# Patient Record
Sex: Female | Born: 1950 | Race: White | Hispanic: No | Marital: Single | Smoking: Never smoker
Health system: Southern US, Community
[De-identification: ages and names within clinical notes are randomized; demographics above are authoritative.]

## PROBLEM LIST (undated history)

## (undated) DIAGNOSIS — C679 Malignant neoplasm of bladder, unspecified: Secondary | ICD-10-CM

## (undated) DIAGNOSIS — C50919 Malignant neoplasm of unspecified site of unspecified female breast: Secondary | ICD-10-CM

## (undated) DIAGNOSIS — Z923 Personal history of irradiation: Secondary | ICD-10-CM

## (undated) DIAGNOSIS — Z9221 Personal history of antineoplastic chemotherapy: Secondary | ICD-10-CM

---

## 2004-04-29 DIAGNOSIS — C679 Malignant neoplasm of bladder, unspecified: Secondary | ICD-10-CM

## 2004-04-29 DIAGNOSIS — Z9221 Personal history of antineoplastic chemotherapy: Secondary | ICD-10-CM

## 2004-04-29 HISTORY — DX: Personal history of antineoplastic chemotherapy: Z92.21

## 2004-04-29 HISTORY — DX: Malignant neoplasm of bladder, unspecified: C67.9

## 2006-04-29 DIAGNOSIS — C50919 Malignant neoplasm of unspecified site of unspecified female breast: Secondary | ICD-10-CM

## 2006-04-29 DIAGNOSIS — Z923 Personal history of irradiation: Secondary | ICD-10-CM

## 2006-04-29 HISTORY — DX: Personal history of irradiation: Z92.3

## 2006-04-29 HISTORY — PX: BREAST LUMPECTOMY: SHX2

## 2006-04-29 HISTORY — PX: BREAST BIOPSY: SHX20

## 2006-04-29 HISTORY — DX: Malignant neoplasm of unspecified site of unspecified female breast: C50.919

## 2010-06-04 ENCOUNTER — Ambulatory Visit: Payer: Self-pay | Admitting: Family Medicine

## 2010-08-29 ENCOUNTER — Ambulatory Visit: Payer: Self-pay | Admitting: Urology

## 2010-09-05 ENCOUNTER — Ambulatory Visit: Payer: Self-pay | Admitting: Urology

## 2010-12-13 ENCOUNTER — Ambulatory Visit: Payer: Self-pay

## 2011-11-13 ENCOUNTER — Ambulatory Visit: Payer: Self-pay | Admitting: Family Medicine

## 2013-01-07 ENCOUNTER — Ambulatory Visit: Payer: Self-pay | Admitting: Family Medicine

## 2013-11-05 ENCOUNTER — Ambulatory Visit: Payer: Self-pay | Admitting: Family Medicine

## 2014-08-18 ENCOUNTER — Ambulatory Visit: Admit: 2014-08-18 | Disposition: A | Discharge status: Home / Self Care | Payer: Self-pay | Admitting: Family Medicine

## 2014-09-14 ENCOUNTER — Ambulatory Visit: Payer: BLUE CROSS/BLUE SHIELD | Attending: Internal Medicine

## 2014-09-14 DIAGNOSIS — G4733 Obstructive sleep apnea (adult) (pediatric): Secondary | ICD-10-CM | POA: Diagnosis present

## 2015-01-23 ENCOUNTER — Other Ambulatory Visit: Payer: Self-pay | Admitting: Family Medicine

## 2015-01-23 DIAGNOSIS — N63 Unspecified lump in unspecified breast: Secondary | ICD-10-CM

## 2015-02-07 ENCOUNTER — Other Ambulatory Visit: Payer: BLUE CROSS/BLUE SHIELD

## 2015-02-07 ENCOUNTER — Ambulatory Visit: Payer: BLUE CROSS/BLUE SHIELD | Attending: Family Medicine

## 2015-03-06 ENCOUNTER — Ambulatory Visit
Admission: RE | Admit: 2015-03-06 | Discharge: 2015-03-06 | Disposition: A | Discharge status: Home / Self Care | Payer: Medicare Other | Source: Ambulatory Visit | Attending: Family Medicine | Admitting: Family Medicine

## 2015-03-06 ENCOUNTER — Other Ambulatory Visit: Payer: Self-pay | Admitting: Family Medicine

## 2015-03-06 DIAGNOSIS — R928 Other abnormal and inconclusive findings on diagnostic imaging of breast: Secondary | ICD-10-CM | POA: Diagnosis not present

## 2015-03-06 DIAGNOSIS — N63 Unspecified lump in unspecified breast: Secondary | ICD-10-CM

## 2015-03-06 HISTORY — DX: Personal history of irradiation: Z92.3

## 2015-03-06 HISTORY — DX: Personal history of antineoplastic chemotherapy: Z92.21

## 2015-03-06 HISTORY — DX: Malignant neoplasm of bladder, unspecified: C67.9

## 2015-03-06 HISTORY — DX: Malignant neoplasm of unspecified site of unspecified female breast: C50.919

## 2016-12-10 ENCOUNTER — Inpatient Hospital Stay (HOSPITAL_COMMUNITY)
Admission: EM | Admit: 2016-12-10 | Discharge: 2016-12-12 | DRG: 063 | Disposition: A | Discharge status: Home / Self Care | Payer: Medicare Other | Attending: Neurology | Admitting: Neurology

## 2016-12-10 ENCOUNTER — Emergency Department (HOSPITAL_COMMUNITY): Payer: Medicare Other

## 2016-12-10 ENCOUNTER — Encounter (HOSPITAL_COMMUNITY): Payer: Self-pay | Admitting: Radiology

## 2016-12-10 ENCOUNTER — Inpatient Hospital Stay (HOSPITAL_COMMUNITY): Payer: Medicare Other

## 2016-12-10 DIAGNOSIS — R55 Syncope and collapse: Secondary | ICD-10-CM | POA: Diagnosis present

## 2016-12-10 DIAGNOSIS — Z882 Allergy status to sulfonamides status: Secondary | ICD-10-CM

## 2016-12-10 DIAGNOSIS — R471 Dysarthria and anarthria: Secondary | ICD-10-CM | POA: Diagnosis present

## 2016-12-10 DIAGNOSIS — E669 Obesity, unspecified: Secondary | ICD-10-CM | POA: Diagnosis present

## 2016-12-10 DIAGNOSIS — R29810 Facial weakness: Secondary | ICD-10-CM | POA: Diagnosis present

## 2016-12-10 DIAGNOSIS — G450 Vertebro-basilar artery syndrome: Secondary | ICD-10-CM | POA: Diagnosis not present

## 2016-12-10 DIAGNOSIS — H532 Diplopia: Secondary | ICD-10-CM | POA: Diagnosis present

## 2016-12-10 DIAGNOSIS — Z853 Personal history of malignant neoplasm of breast: Secondary | ICD-10-CM | POA: Diagnosis not present

## 2016-12-10 DIAGNOSIS — Z6833 Body mass index (BMI) 33.0-33.9, adult: Secondary | ICD-10-CM

## 2016-12-10 DIAGNOSIS — Z9221 Personal history of antineoplastic chemotherapy: Secondary | ICD-10-CM

## 2016-12-10 DIAGNOSIS — R260 Ataxic gait: Secondary | ICD-10-CM | POA: Diagnosis present

## 2016-12-10 DIAGNOSIS — E119 Type 2 diabetes mellitus without complications: Secondary | ICD-10-CM | POA: Diagnosis present

## 2016-12-10 DIAGNOSIS — I6330 Cerebral infarction due to thrombosis of unspecified cerebral artery: Secondary | ICD-10-CM | POA: Insufficient documentation

## 2016-12-10 DIAGNOSIS — Z885 Allergy status to narcotic agent status: Secondary | ICD-10-CM

## 2016-12-10 DIAGNOSIS — I10 Essential (primary) hypertension: Secondary | ICD-10-CM | POA: Diagnosis present

## 2016-12-10 DIAGNOSIS — R29702 NIHSS score 2: Secondary | ICD-10-CM | POA: Diagnosis present

## 2016-12-10 DIAGNOSIS — R569 Unspecified convulsions: Secondary | ICD-10-CM | POA: Diagnosis not present

## 2016-12-10 DIAGNOSIS — Z923 Personal history of irradiation: Secondary | ICD-10-CM

## 2016-12-10 DIAGNOSIS — G459 Transient cerebral ischemic attack, unspecified: Secondary | ICD-10-CM | POA: Diagnosis present

## 2016-12-10 DIAGNOSIS — E785 Hyperlipidemia, unspecified: Secondary | ICD-10-CM | POA: Diagnosis present

## 2016-12-10 DIAGNOSIS — E1159 Type 2 diabetes mellitus with other circulatory complications: Secondary | ICD-10-CM | POA: Diagnosis not present

## 2016-12-10 DIAGNOSIS — I639 Cerebral infarction, unspecified: Secondary | ICD-10-CM | POA: Diagnosis present

## 2016-12-10 DIAGNOSIS — G8191 Hemiplegia, unspecified affecting right dominant side: Secondary | ICD-10-CM | POA: Diagnosis present

## 2016-12-10 DIAGNOSIS — Z8551 Personal history of malignant neoplasm of bladder: Secondary | ICD-10-CM | POA: Diagnosis not present

## 2016-12-10 DIAGNOSIS — R4020 Unspecified coma: Secondary | ICD-10-CM | POA: Diagnosis not present

## 2016-12-10 LAB — ECHOCARDIOGRAM COMPLETE
Area-P 1/2: 3.14 cm2
E decel time: 239 msec
E/e' ratio: 11.73
FS: 41 % (ref 28–44)
Height: 66 in
IV/PV OW: 0.91
LA ID, A-P, ES: 33 mm
LA diam index: 1.63 cm/m2
LA vol A4C: 47 ml
LDCA: 3.46 cm2
LEFT ATRIUM END SYS DIAM: 33 mm
LV PW d: 11 mm — AB (ref 0.6–1.1)
LV TDI E'LATERAL: 7.94
LVEEAVG: 11.73
LVEEMED: 11.73
LVELAT: 7.94 cm/s
LVOT SV: 67 mL
LVOT VTI: 19.3 cm
LVOT peak grad rest: 4 mmHg
LVOTD: 21 mm
LVOTPV: 96.1 cm/s
Lateral S' vel: 14.4 cm/s
MV Dec: 239
MV pk E vel: 93.1 m/s
MVPG: 3 mmHg
MVPKAVEL: 79.5 m/s
MVSPHT: 70 ms
RV TAPSE: 20 mm
TDI e' medial: 6.31
Weight: 3291.03 oz

## 2016-12-10 LAB — COMPREHENSIVE METABOLIC PANEL
ALT: 23 U/L (ref 14–54)
AST: 32 U/L (ref 15–41)
Albumin: 4.1 g/dL (ref 3.5–5.0)
Alkaline Phosphatase: 70 U/L (ref 38–126)
Anion gap: 10 (ref 5–15)
BUN: 10 mg/dL (ref 6–20)
CO2: 23 mmol/L (ref 22–32)
Calcium: 9 mg/dL (ref 8.9–10.3)
Chloride: 105 mmol/L (ref 101–111)
Creatinine, Ser: 0.81 mg/dL (ref 0.44–1.00)
GFR calc Af Amer: >60 mL/min (ref >60)
GFR calc non Af Amer: >60 mL/min (ref >60)
Glucose, Bld: 191 mg/dL — ABNORMAL HIGH (ref 65–99)
Potassium: 4.1 mmol/L (ref 3.5–5.1)
Sodium: 138 mmol/L (ref 135–145)
Total Bilirubin: 0.6 mg/dL (ref 0.3–1.2)
Total Protein: 6.5 g/dL (ref 6.5–8.1)

## 2016-12-10 LAB — I-STAT CHEM 8, ED
BUN: 11 mg/dL (ref 6–20)
Calcium, Ion: 1.09 mmol/L — ABNORMAL LOW (ref 1.15–1.40)
Chloride: 105 mmol/L (ref 101–111)
Creatinine, Ser: 0.7 mg/dL (ref 0.44–1.00)
Glucose, Bld: 184 mg/dL — ABNORMAL HIGH (ref 65–99)
HCT: 37 % (ref 36.0–46.0)
Hemoglobin: 12.6 g/dL (ref 12.0–15.0)
Potassium: 4.1 mmol/L (ref 3.5–5.1)
Sodium: 140 mmol/L (ref 135–145)
TCO2: 25 mmol/L (ref 0–100)

## 2016-12-10 LAB — DIFFERENTIAL
Basophils Absolute: 0.1 10*3/uL (ref 0.0–0.1)
Basophils Relative: 1 %
Eosinophils Absolute: 0.2 10*3/uL (ref 0.0–0.7)
Eosinophils Relative: 3 %
Lymphocytes Relative: 41 %
Lymphs Abs: 2.7 10*3/uL (ref 0.7–4.0)
Monocytes Absolute: 0.5 10*3/uL (ref 0.1–1.0)
Monocytes Relative: 7 %
Neutro Abs: 3.2 10*3/uL (ref 1.7–7.7)
Neutrophils Relative %: 48 %

## 2016-12-10 LAB — PROTIME-INR
INR: 0.86
Prothrombin Time: 11.7 seconds (ref 11.4–15.2)

## 2016-12-10 LAB — CBC
HCT: 38.4 % (ref 36.0–46.0)
Hemoglobin: 12.5 g/dL (ref 12.0–15.0)
MCH: 28.7 pg (ref 26.0–34.0)
MCHC: 32.6 g/dL (ref 30.0–36.0)
MCV: 88.3 fL (ref 78.0–100.0)
Platelets: 208 10*3/uL (ref 150–400)
RBC: 4.35 MIL/uL (ref 3.87–5.11)
RDW: 14.9 % (ref 11.5–15.5)
WBC: 6.6 10*3/uL (ref 4.0–10.5)

## 2016-12-10 LAB — MRSA PCR SCREENING: MRSA by PCR: NEGATIVE

## 2016-12-10 LAB — I-STAT TROPONIN, ED: Troponin i, poc: 0 ng/mL (ref 0.00–0.08)

## 2016-12-10 LAB — CBG MONITORING, ED: GLUCOSE-CAPILLARY: 184 mg/dL — AB (ref 65–99)

## 2016-12-10 LAB — TSH: TSH: 0.225 u[IU]/mL — ABNORMAL LOW (ref 0.350–4.500)

## 2016-12-10 LAB — APTT: aPTT: 29 seconds (ref 24–36)

## 2016-12-10 MED ORDER — ACETAMINOPHEN 325 MG PO TABS
650.0000 mg | ORAL_TABLET | ORAL | Status: DC | PRN
  Administered 2016-12-11: 650 mg via ORAL
  Filled 2016-12-10: qty 2

## 2016-12-10 MED ORDER — STROKE: EARLY STAGES OF RECOVERY BOOK
Freq: Once | Status: AC
  Administered 2016-12-10: 18:00:00
  Filled 2016-12-10 (×2): qty 1

## 2016-12-10 MED ORDER — ACETAMINOPHEN 160 MG/5ML PO SOLN
650.0000 mg | ORAL | Status: DC | PRN
Start: 2016-12-10 — End: 2016-12-11

## 2016-12-10 MED ORDER — ACETAMINOPHEN 650 MG RE SUPP: 650.0000 mg | RECTAL | Status: DC | PRN

## 2016-12-10 MED ORDER — SENNOSIDES-DOCUSATE SODIUM 8.6-50 MG PO TABS: 1.0000 | ORAL_TABLET | Freq: Every evening | ORAL | Status: DC | PRN

## 2016-12-10 MED ORDER — IOPAMIDOL (ISOVUE-370) INJECTION 76%
INTRAVENOUS | Status: AC
  Administered 2016-12-10: 50 mL
  Filled 2016-12-10: qty 50

## 2016-12-10 MED ORDER — ALTEPLASE (STROKE) FULL DOSE INFUSION
0.9000 mg/kg | Freq: Once | INTRAVENOUS | Status: AC
  Administered 2016-12-10: 84 mg via INTRAVENOUS
  Filled 2016-12-10: qty 100

## 2016-12-10 MED ORDER — SODIUM CHLORIDE 0.9 % IV SOLN
INTRAVENOUS | Status: DC
  Administered 2016-12-10: 10:00:00 via INTRAVENOUS

## 2016-12-10 MED ORDER — PANTOPRAZOLE SODIUM 40 MG IV SOLR
40.0000 mg | Freq: Every day | INTRAVENOUS | Status: DC
  Administered 2016-12-10: 40 mg via INTRAVENOUS
  Filled 2016-12-10: qty 40
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
## 2016-12-10 NOTE — Progress Notes (Signed)
  Echocardiogram 2D Echocardiogram has been performed.  Shelia Evans T [name] 12/10/2016, 3:30 PM

## 2016-12-11 ENCOUNTER — Inpatient Hospital Stay (HOSPITAL_COMMUNITY): Payer: Medicare Other

## 2016-12-11 DIAGNOSIS — G450 Vertebro-basilar artery syndrome: Secondary | ICD-10-CM

## 2016-12-11 DIAGNOSIS — R569 Unspecified convulsions: Secondary | ICD-10-CM

## 2016-12-11 DIAGNOSIS — E785 Hyperlipidemia, unspecified: Secondary | ICD-10-CM

## 2016-12-11 DIAGNOSIS — E1159 Type 2 diabetes mellitus with other circulatory complications: Secondary | ICD-10-CM

## 2016-12-11 LAB — LIPID PANEL
CHOL/HDL RATIO: 4.1 ratio
CHOLESTEROL: 174 mg/dL (ref 0–200)
HDL: 42 mg/dL (ref >40)
LDL Cholesterol: 92 mg/dL (ref 0–99)
TRIGLYCERIDES: 200 mg/dL — AB (ref <150)
VLDL: 40 mg/dL (ref 0–40)

## 2016-12-11 LAB — GLUCOSE, CAPILLARY
GLUCOSE-CAPILLARY: 171 mg/dL — AB (ref 65–99)
Glucose-Capillary: 156 mg/dL — ABNORMAL HIGH (ref 65–99)

## 2016-12-11 MED ORDER — INSULIN ASPART 100 UNIT/ML ~~LOC~~ SOLN
0.0000 [IU] | Freq: Three times a day (TID) | SUBCUTANEOUS | Status: DC
  Administered 2016-12-11 – 2016-12-12 (×3): 3 [IU] via SUBCUTANEOUS

## 2016-12-11 MED ORDER — SIMVASTATIN 20 MG PO TABS
20.0000 mg | ORAL_TABLET | Freq: Every day | ORAL | Status: DC
  Administered 2016-12-11: 20 mg via ORAL
  Filled 2016-12-11: qty 1

## 2016-12-11 MED ORDER — ESCITALOPRAM OXALATE 10 MG PO TABS
20.0000 mg | ORAL_TABLET | Freq: Every day | ORAL | Status: DC
  Administered 2016-12-11 – 2016-12-12 (×2): 20 mg via ORAL
  Filled 2016-12-11 (×2): qty 2

## 2016-12-11 MED ORDER — INSULIN ASPART 100 UNIT/ML ~~LOC~~ SOLN: 0.0000 [IU] | Freq: Every day | SUBCUTANEOUS | Status: DC

## 2016-12-11 MED ORDER — ASPIRIN EC 81 MG PO TBEC
81.0000 mg | DELAYED_RELEASE_TABLET | Freq: Every day | ORAL | Status: DC
Start: 2016-12-11 — End: 2016-12-12
  Administered 2016-12-11 – 2016-12-12 (×2): 81 mg via ORAL
  Filled 2016-12-11 (×2): qty 1

## 2016-12-11 MED ORDER — ALPRAZOLAM 0.5 MG PO TABS
0.5000 mg | ORAL_TABLET | Freq: Once | ORAL | Status: AC
  Administered 2016-12-11: 0.5 mg via ORAL
  Filled 2016-12-11: qty 1

## 2016-12-11 MED ORDER — LEVOTHYROXINE SODIUM 25 MCG PO TABS
125.0000 ug | ORAL_TABLET | Freq: Every day | ORAL | Status: DC
  Administered 2016-12-11 – 2016-12-12 (×2): 125 ug via ORAL
  Filled 2016-12-11 (×2): qty 1

## 2016-12-11 MED ORDER — PANTOPRAZOLE SODIUM 40 MG PO TBEC
40.0000 mg | DELAYED_RELEASE_TABLET | Freq: Every day | ORAL | Status: DC
  Administered 2016-12-11 – 2016-12-12 (×2): 40 mg via ORAL
  Filled 2016-12-11 (×2): qty 1

## 2016-12-11 NOTE — Progress Notes (Signed)
SLP Cancellation Note  Patient Details Name: Shelia Evans MRN: [id number] DOB: November 03, 1950   Cancelled treatment:       Reason Eval/Treat Not Completed: Patient at procedure or test/unavailable   Chalisa Kobler, Katherene Ponto 12/11/2016, 10:29 AM

## 2016-12-11 NOTE — Progress Notes (Signed)
EEG completed, results pending. 
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
## 2016-12-11 NOTE — Progress Notes (Signed)
PT Cancellation Note  Patient Details Name: Shelia Evans MRN: [id number] DOB: March 31, 1951   Cancelled Treatment:    Reason Eval/Treat Not Completed: Patient not medically ready. Pt currently on bedrest. Will continue to follow for medical readiness for PT eval.    Thelma Comp 12/11/2016, 7:27 AM   Rolinda Roan, PT, DPT Acute Rehabilitation Services Pager: [phone]

## 2016-12-11 NOTE — Progress Notes (Signed)
OT Cancellation Note  Patient Details Name: Shelia Evans MRN: [id number] DOB: 02/11/1951   Cancelled Treatment:    Reason Eval/Treat Not Completed: Patient not medically ready. Pt with active bedrest orders. Will await increase in activity orders prior to initiating OT evaluation. Thank you for this referral!  Norman Herrlich, MS OTR/L  Pager: [phone] 12/11/2016, 7:30 AM

## 2016-12-11 NOTE — Progress Notes (Signed)
Pt received from 4N with no noted distress. Pt stable, neuro intact. Telemetry monitoring. Pt denies pain or discomfort. Educated to room. Safety measures in place. Call bell within reach. Will continue to monitor.

## 2016-12-12 ENCOUNTER — Inpatient Hospital Stay (HOSPITAL_COMMUNITY): Payer: Medicare Other

## 2016-12-12 DIAGNOSIS — R4020 Unspecified coma: Secondary | ICD-10-CM

## 2016-12-12 DIAGNOSIS — G459 Transient cerebral ischemic attack, unspecified: Secondary | ICD-10-CM

## 2016-12-12 LAB — GLUCOSE, CAPILLARY
Glucose-Capillary: 170 mg/dL — ABNORMAL HIGH (ref 65–99)
Glucose-Capillary: 173 mg/dL — ABNORMAL HIGH (ref 65–99)

## 2016-12-12 LAB — HEMOGLOBIN A1C
HEMOGLOBIN A1C: 7.5 % — AB (ref 4.8–5.6)
MEAN PLASMA GLUCOSE: 169 mg/dL

## 2016-12-12 MED ORDER — ASPIRIN 81 MG PO TBEC: 81.0000 mg | DELAYED_RELEASE_TABLET | Freq: Every day | ORAL | 0 refills | Status: AC

## 2016-12-12 NOTE — Progress Notes (Signed)
*  PRELIMINARY RESULTS* Vascular Ultrasound Lower extremity venous duplex has been completed.  Preliminary findings: No evidence of DVT or baker's cyst.    Landry Mellow, RDMS, RVT  12/12/2016, 11:23 AM

## 2016-12-12 NOTE — Care Management Note (Signed)
Case Management Note  Patient Details  Name: Shelia Evans MRN: [id number] Date of Birth: Jun 18, 1950  Subjective/Objective:                    Action/Plan: Pt discharging home with self care. No f/u and no DME needs per PT/OT. Pt has insurance, PCP (Dr Raliegh Ip. Richarda Overlie) and transportation home. No further needs per CM.   Expected Discharge Date:  12/12/16               Expected Discharge Plan:  Home/Self Care  In-House Referral:     Discharge planning Services     Post Acute Care Choice:    Choice offered to:     DME Arranged:    DME Agency:     HH Arranged:    HH Agency:     Status of Service:  Completed, signed off  If discussed at [employer] of Stay Meetings, dates discussed:    Additional Comments:  Pollie Friar, RN 12/12/2016, 3:43 PM

## 2016-12-12 NOTE — Progress Notes (Signed)
PT Cancellation Note  Patient Details Name: Shelia Evans MRN: [id number] DOB: May 05, 1950   Cancelled Treatment:    Reason Eval/Treat Not Completed: PT screened, no needs identified, will sign off. Pt evaluated by OT with no acute PT needs identified. PT signing off.    [place] 12/12/2016, 9:50 AM
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
## 2016-12-12 NOTE — Progress Notes (Addendum)
Discharge instructions reviewed with patient/family. All questions answered at this time. Patient is waiting on transportation from niece.   Ave Filter, RN
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
## 2016-12-12 NOTE — Procedures (Signed)
ELECTROENCEPHALOGRAM REPORT  Date of Study: 12/11/2016  Patient's Name: Shelia Evans MRN: [id number] Date of Birth: 31-Mar-1951  Referring Provider: Dr. Rosalin Hawking  Clinical History: This is a 66 year old woman with ataxia, loss of consciousness, double vision, slurred speech, left facial droop.  Medications: acetaminophen (TYLENOL) tablet 650 mg  aspirin EC tablet 81 mg  escitalopram (LEXAPRO) tablet 20 mg  insulin aspart (novoLOG) injection 0-15 Units  insulin aspart (novoLOG) injection 0-5 Units  levothyroxine (SYNTHROID, LEVOTHROID) tablet 125 mcg  pantoprazole (PROTONIX) EC tablet 40 mg  senna-docusate (Senokot-S) tablet 1 tablet  simvastatin (ZOCOR) tablet 20 mg   Technical Summary: A multichannel digital EEG recording measured by the international 10-20 system with electrodes applied with paste and impedances below 5000 ohms performed in our laboratory with EKG monitoring in an awake and drowsy patient.  Hyperventilation was not performed. Photic stimulation was performed.  The digital EEG was referentially recorded, reformatted, and digitally filtered in a variety of bipolar and referential montages for optimal display.    Description: The patient is awake and drowsy during the recording.  During maximal wakefulness, there is a symmetric, medium voltage 10 Hz posterior dominant rhythm that attenuates with eye opening.  There is occasional focal theta slowing over the left temporal region.During drowsiness, there is an increase in theta slowing of the background.  Deeper stages of sleep were not seen. Photic stimulation did not elicit any abnormalities.  There were no epileptiform discharges or electrographic seizures seen.    EKG lead was unremarkable.  Impression: This awake and asleep EEG is abnormal due to occasional focal slowing over the left temporal region.  Clinical Correlation of the above findings indicates focal cerebral dysfunction over the left temporal  region suggestive of underlying structural or physiologic abnormality. The absence of epileptiform discharges does not exclude a clinical diagnosis of epilepsy. Clinical correlation is advised.   Ellouise Newer, M.D.

## 2017-03-18 ENCOUNTER — Other Ambulatory Visit: Payer: Self-pay

## 2017-03-19 ENCOUNTER — Other Ambulatory Visit: Payer: Self-pay

## 2017-03-19 NOTE — Patient Outreach (Signed)
Patient refused to be interviewed. Unable to obtain mRs. = 7

## 2017-03-24 ENCOUNTER — Other Ambulatory Visit: Payer: Self-pay

## 2017-03-24 NOTE — Addendum Note (Signed)
Addended byCalla Kicks T on: 03/24/2017 01:23 PM   Modules accepted: Level of Service, SmartSet

## 2017-03-24 NOTE — Telephone Encounter (Signed)
This encounter was created in error - please disregard.
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
## 2017-04-23 ENCOUNTER — Ambulatory Visit: Payer: Self-pay | Admitting: Nurse Practitioner

## 2017-04-24 ENCOUNTER — Encounter: Payer: Self-pay | Admitting: Nurse Practitioner

## 2017-05-01 ENCOUNTER — Telehealth: Payer: Self-pay | Admitting: Neurology

## 2017-05-01 NOTE — Telephone Encounter (Signed)
Message sent to [facility] NP. Pt was schedule with her . This is a Pharmacist, hospital.

## 2017-05-01 NOTE — Telephone Encounter (Signed)
Pt called she was not aware of the appt on 12/26. Pt said she actually saw another neurologist the same day. Pt apologized for any inconvenience it may have caused the clinic. She does not wish to r.s   [facility]

## 2017-12-19 ENCOUNTER — Other Ambulatory Visit: Payer: Self-pay | Admitting: Family Medicine

## 2017-12-19 DIAGNOSIS — Z1231 Encounter for screening mammogram for malignant neoplasm of breast: Secondary | ICD-10-CM

## 2017-12-31 ENCOUNTER — Ambulatory Visit
Admission: RE | Admit: 2017-12-31 | Discharge: 2017-12-31 | Disposition: A | Discharge status: Home / Self Care | Payer: Medicare Other | Source: Ambulatory Visit | Attending: Family Medicine | Admitting: Family Medicine

## 2017-12-31 DIAGNOSIS — Z1231 Encounter for screening mammogram for malignant neoplasm of breast: Secondary | ICD-10-CM

## 2019-03-30 ENCOUNTER — Other Ambulatory Visit: Payer: Self-pay | Admitting: Family Medicine

## 2019-03-30 DIAGNOSIS — Z1231 Encounter for screening mammogram for malignant neoplasm of breast: Secondary | ICD-10-CM

## 2019-04-06 ENCOUNTER — Ambulatory Visit
Admission: RE | Admit: 2019-04-06 | Discharge: 2019-04-06 | Disposition: A | Discharge status: Home / Self Care | Payer: Medicare HMO | Source: Ambulatory Visit | Attending: Family Medicine | Admitting: Family Medicine

## 2019-04-06 DIAGNOSIS — Z1231 Encounter for screening mammogram for malignant neoplasm of breast: Secondary | ICD-10-CM

## 2019-06-01 ENCOUNTER — Emergency Department: Payer: Medicare HMO

## 2019-06-01 ENCOUNTER — Encounter: Payer: Self-pay | Admitting: Emergency Medicine

## 2019-06-01 ENCOUNTER — Inpatient Hospital Stay
Admission: EM | Admit: 2019-06-01 | Discharge: 2019-06-07 | DRG: 494 | Disposition: A | Discharge status: Skilled Nursing Facility | Payer: Medicare HMO | Attending: Orthopedic Surgery | Admitting: Orthopedic Surgery

## 2019-06-01 ENCOUNTER — Other Ambulatory Visit: Payer: Self-pay

## 2019-06-01 DIAGNOSIS — E785 Hyperlipidemia, unspecified: Secondary | ICD-10-CM | POA: Diagnosis present

## 2019-06-01 DIAGNOSIS — Z888 Allergy status to other drugs, medicaments and biological substances status: Secondary | ICD-10-CM

## 2019-06-01 DIAGNOSIS — Z881 Allergy status to other antibiotic agents status: Secondary | ICD-10-CM | POA: Diagnosis not present

## 2019-06-01 DIAGNOSIS — K0889 Other specified disorders of teeth and supporting structures: Secondary | ICD-10-CM | POA: Diagnosis present

## 2019-06-01 DIAGNOSIS — W108XXA Fall (on) (from) other stairs and steps, initial encounter: Secondary | ICD-10-CM | POA: Diagnosis present

## 2019-06-01 DIAGNOSIS — Z9221 Personal history of antineoplastic chemotherapy: Secondary | ICD-10-CM | POA: Diagnosis not present

## 2019-06-01 DIAGNOSIS — Z8551 Personal history of malignant neoplasm of bladder: Secondary | ICD-10-CM

## 2019-06-01 DIAGNOSIS — Z8673 Personal history of transient ischemic attack (TIA), and cerebral infarction without residual deficits: Secondary | ICD-10-CM

## 2019-06-01 DIAGNOSIS — Z7984 Long term (current) use of oral hypoglycemic drugs: Secondary | ICD-10-CM

## 2019-06-01 DIAGNOSIS — Z885 Allergy status to narcotic agent status: Secondary | ICD-10-CM | POA: Diagnosis not present

## 2019-06-01 DIAGNOSIS — Z7982 Long term (current) use of aspirin: Secondary | ICD-10-CM

## 2019-06-01 DIAGNOSIS — S82892A Other fracture of left lower leg, initial encounter for closed fracture: Secondary | ICD-10-CM | POA: Diagnosis not present

## 2019-06-01 DIAGNOSIS — F419 Anxiety disorder, unspecified: Secondary | ICD-10-CM | POA: Diagnosis present

## 2019-06-01 DIAGNOSIS — W19XXXA Unspecified fall, initial encounter: Secondary | ICD-10-CM

## 2019-06-01 DIAGNOSIS — Z825 Family history of asthma and other chronic lower respiratory diseases: Secondary | ICD-10-CM

## 2019-06-01 DIAGNOSIS — Z79899 Other long term (current) drug therapy: Secondary | ICD-10-CM

## 2019-06-01 DIAGNOSIS — F329 Major depressive disorder, single episode, unspecified: Secondary | ICD-10-CM | POA: Diagnosis present

## 2019-06-01 DIAGNOSIS — E1169 Type 2 diabetes mellitus with other specified complication: Secondary | ICD-10-CM | POA: Diagnosis present

## 2019-06-01 DIAGNOSIS — S82899A Other fracture of unspecified lower leg, initial encounter for closed fracture: Secondary | ICD-10-CM

## 2019-06-01 DIAGNOSIS — Y92009 Unspecified place in unspecified non-institutional (private) residence as the place of occurrence of the external cause: Secondary | ICD-10-CM

## 2019-06-01 DIAGNOSIS — S82852A Displaced trimalleolar fracture of left lower leg, initial encounter for closed fracture: Secondary | ICD-10-CM | POA: Diagnosis present

## 2019-06-01 DIAGNOSIS — Z79890 Hormone replacement therapy (postmenopausal): Secondary | ICD-10-CM | POA: Diagnosis not present

## 2019-06-01 DIAGNOSIS — Z20822 Contact with and (suspected) exposure to covid-19: Secondary | ICD-10-CM | POA: Diagnosis present

## 2019-06-01 DIAGNOSIS — F4310 Post-traumatic stress disorder, unspecified: Secondary | ICD-10-CM | POA: Diagnosis present

## 2019-06-01 DIAGNOSIS — Z882 Allergy status to sulfonamides status: Secondary | ICD-10-CM | POA: Diagnosis not present

## 2019-06-01 DIAGNOSIS — Z886 Allergy status to analgesic agent status: Secondary | ICD-10-CM | POA: Diagnosis not present

## 2019-06-01 DIAGNOSIS — Z853 Personal history of malignant neoplasm of breast: Secondary | ICD-10-CM | POA: Diagnosis not present

## 2019-06-01 DIAGNOSIS — G4733 Obstructive sleep apnea (adult) (pediatric): Secondary | ICD-10-CM

## 2019-06-01 DIAGNOSIS — Z923 Personal history of irradiation: Secondary | ICD-10-CM | POA: Diagnosis not present

## 2019-06-01 DIAGNOSIS — E039 Hypothyroidism, unspecified: Secondary | ICD-10-CM | POA: Diagnosis present

## 2019-06-01 DIAGNOSIS — M25572 Pain in left ankle and joints of left foot: Secondary | ICD-10-CM | POA: Diagnosis present

## 2019-06-01 LAB — COMPREHENSIVE METABOLIC PANEL
ALT: 18 U/L (ref 0–44)
AST: 26 U/L (ref 15–41)
Albumin: 4.4 g/dL (ref 3.5–5.0)
Alkaline Phosphatase: 52 U/L (ref 38–126)
Anion gap: 11 (ref 5–15)
BUN: 19 mg/dL (ref 8–23)
CO2: 25 mmol/L (ref 22–32)
Calcium: 9.4 mg/dL (ref 8.9–10.3)
Chloride: 105 mmol/L (ref 98–111)
Creatinine, Ser: 0.82 mg/dL (ref 0.44–1.00)
GFR calc Af Amer: >60 mL/min (ref >60)
GFR calc non Af Amer: >60 mL/min (ref >60)
Glucose, Bld: 153 mg/dL — ABNORMAL HIGH (ref 70–99)
Potassium: 4.5 mmol/L (ref 3.5–5.1)
Sodium: 141 mmol/L (ref 135–145)
Total Bilirubin: 0.7 mg/dL (ref 0.3–1.2)
Total Protein: 7.1 g/dL (ref 6.5–8.1)

## 2019-06-01 LAB — CBC WITH DIFFERENTIAL/PLATELET
Abs Immature Granulocytes: 0.04 10*3/uL (ref 0.00–0.07)
Basophils Absolute: 0.1 10*3/uL (ref 0.0–0.1)
Basophils Relative: 1 %
Eosinophils Absolute: 0.1 10*3/uL (ref 0.0–0.5)
Eosinophils Relative: 1 %
HCT: 38.5 % (ref 36.0–46.0)
Hemoglobin: 12.4 g/dL (ref 12.0–15.0)
Immature Granulocytes: 0 %
Lymphocytes Relative: 19 %
Lymphs Abs: 2.4 10*3/uL (ref 0.7–4.0)
MCH: 28.6 pg (ref 26.0–34.0)
MCHC: 32.2 g/dL (ref 30.0–36.0)
MCV: 88.9 fL (ref 80.0–100.0)
Monocytes Absolute: 0.7 10*3/uL (ref 0.1–1.0)
Monocytes Relative: 5 %
Neutro Abs: 9.5 10*3/uL — ABNORMAL HIGH (ref 1.7–7.7)
Neutrophils Relative %: 74 %
Platelets: 252 10*3/uL (ref 150–400)
RBC: 4.33 MIL/uL (ref 3.87–5.11)
RDW: 14 % (ref 11.5–15.5)
WBC: 12.9 10*3/uL — ABNORMAL HIGH (ref 4.0–10.5)
nRBC: 0 % (ref 0.0–0.2)

## 2019-06-01 LAB — GLUCOSE, CAPILLARY: Glucose-Capillary: 161 mg/dL — ABNORMAL HIGH (ref 70–99)

## 2019-06-01 MED ORDER — HYDROCODONE-ACETAMINOPHEN 5-325 MG PO TABS
1.0000 | ORAL_TABLET | ORAL | Status: DC | PRN
  Administered 2019-06-01: 18:00:00 2 via ORAL
  Filled 2019-06-01: qty 2

## 2019-06-01 MED ORDER — HYDROMORPHONE HCL 1 MG/ML IJ SOLN
0.5000 mg | INTRAMUSCULAR | Status: DC | PRN
  Administered 2019-06-01: 0.5 mg via INTRAVENOUS
  Filled 2019-06-01 (×2): qty 1

## 2019-06-01 MED ORDER — MORPHINE SULFATE (PF) 2 MG/ML IV SOLN
0.5000 mg | INTRAVENOUS | Status: DC | PRN
  Filled 2019-06-01: qty 1

## 2019-06-01 MED ORDER — SODIUM CHLORIDE 0.9% FLUSH: 9.0000 mL | INTRAVENOUS | Status: DC | PRN

## 2019-06-01 MED ORDER — HYDROMORPHONE 1 MG/ML IV SOLN: INTRAVENOUS | Status: DC

## 2019-06-01 MED ORDER — HYDROMORPHONE 1 MG/ML IV SOLN
INTRAVENOUS | Status: DC
  Filled 2019-06-01: qty 30

## 2019-06-01 MED ORDER — ONDANSETRON HCL 4 MG/2ML IJ SOLN
4.0000 mg | Freq: Four times a day (QID) | INTRAMUSCULAR | Status: DC | PRN
  Administered 2019-06-02: 4 mg via INTRAVENOUS

## 2019-06-01 MED ORDER — DIPHENHYDRAMINE HCL 12.5 MG/5ML PO ELIX: 12.5000 mg | ORAL_SOLUTION | Freq: Four times a day (QID) | ORAL | Status: DC | PRN

## 2019-06-01 MED ORDER — DIPHENHYDRAMINE HCL 50 MG/ML IJ SOLN: 12.5000 mg | Freq: Four times a day (QID) | INTRAMUSCULAR | Status: DC | PRN

## 2019-06-01 MED ORDER — HYDROCODONE-ACETAMINOPHEN 7.5-325 MG PO TABS
1.0000 | ORAL_TABLET | ORAL | Status: DC | PRN
  Administered 2019-06-01: 20:00:00 2 via ORAL
  Filled 2019-06-01 (×2): qty 2

## 2019-06-01 MED ORDER — NALOXONE HCL 0.4 MG/ML IJ SOLN: 0.4000 mg | INTRAMUSCULAR | Status: DC | PRN

## 2019-06-01 MED ORDER — LACTATED RINGERS IV SOLN: INTRAVENOUS | Status: DC

## 2019-06-01 MED ORDER — FENTANYL CITRATE (PF) 100 MCG/2ML IJ SOLN
50.0000 ug | Freq: Once | INTRAMUSCULAR | Status: AC
  Administered 2019-06-01: 14:00:00 50 ug via INTRAVENOUS
  Filled 2019-06-01: qty 2

## 2019-06-01 MED ORDER — METFORMIN HCL 500 MG PO TABS: 1000.0000 mg | ORAL_TABLET | Freq: Two times a day (BID) | ORAL | Status: DC

## 2019-06-01 MED ORDER — ONDANSETRON HCL 4 MG/2ML IJ SOLN: 4.0000 mg | Freq: Once | INTRAMUSCULAR | Status: AC

## 2019-06-01 MED ORDER — LOSARTAN POTASSIUM 50 MG PO TABS: 25.0000 mg | ORAL_TABLET | Freq: Every day | ORAL | Status: DC

## 2019-06-01 MED ORDER — GLIMEPIRIDE 4 MG PO TABS: 4.0000 mg | ORAL_TABLET | Freq: Two times a day (BID) | ORAL | Status: DC

## 2019-06-01 MED ORDER — ONDANSETRON HCL 4 MG/2ML IJ SOLN
INTRAMUSCULAR | Status: AC
  Administered 2019-06-01: 15:00:00 4 mg via INTRAVENOUS
  Filled 2019-06-01: qty 2

## 2019-06-01 MED ORDER — ONDANSETRON HCL 4 MG/2ML IJ SOLN: 4.0000 mg | Freq: Four times a day (QID) | INTRAMUSCULAR | Status: DC | PRN

## 2019-06-01 MED ORDER — HYDROMORPHONE HCL 1 MG/ML IJ SOLN
1.0000 mg | Freq: Once | INTRAMUSCULAR | Status: AC
  Administered 2019-06-01: 15:00:00 1 mg via INTRAVENOUS
  Filled 2019-06-01: qty 1

## 2019-06-01 MED ORDER — HYDROMORPHONE HCL 1 MG/ML IJ SOLN
1.0000 mg | INTRAMUSCULAR | Status: AC
  Administered 2019-06-01: 23:00:00 1 mg via INTRAVENOUS

## 2019-06-01 MED ORDER — INSULIN ASPART 100 UNIT/ML ~~LOC~~ SOLN
0.0000 [IU] | Freq: Three times a day (TID) | SUBCUTANEOUS | Status: DC
  Administered 2019-06-01 – 2019-06-03 (×5): 2 [IU] via SUBCUTANEOUS
  Administered 2019-06-04: 18:00:00 1 [IU] via SUBCUTANEOUS
  Administered 2019-06-04: 12:00:00 2 [IU] via SUBCUTANEOUS
  Administered 2019-06-04 – 2019-06-06 (×4): 1 [IU] via SUBCUTANEOUS
  Administered 2019-06-06: 12:00:00 2 [IU] via SUBCUTANEOUS
  Administered 2019-06-07: 1 [IU] via SUBCUTANEOUS
  Administered 2019-06-07: 2 [IU] via SUBCUTANEOUS
  Filled 2019-06-01 (×14): qty 1

## 2019-06-01 MED ORDER — INSULIN ASPART 100 UNIT/ML ~~LOC~~ SOLN
0.0000 [IU] | Freq: Every day | SUBCUTANEOUS | Status: DC
  Administered 2019-06-02 – 2019-06-03 (×2): 2 [IU] via SUBCUTANEOUS
  Filled 2019-06-01 (×2): qty 1

## 2019-06-01 NOTE — Consult Note (Signed)
[facility] Initial Consultation Note   Patient Name: Shelia Evans    [id number] PCP: Dion Body, MD     DOB: 04/22/1951  DOA: 06/01/2019 DOS: the patient was seen and examined on 06/01/2019   Referring physician: Johnn Hai, PA-C Reason for consult: Medical management and potential admission  HPI: Shelia Evans is a 69 y.o. female with Past medical history of bladder cancer diagnosed in 2006 and treated currently in remission, right-sided breast cancer SP chemoradiation treated in 2008 currently in remission, hypothyroidism, anxiety, type 2 diabetes mellitus with hyperlipidemia OSA on CPAP. Patient is coming from Home Patient presented with a mechanical fall.  She was carrying boxes down the steps and missed the last 2 step and fell on her ankles.  She denies having any head injury or neck injury.  Denies having any loss of consciousness. Denies having any tingling or numbness in her left leg. Denies having any pain anywhere else. No chest pain abdominal pain no nausea no vomiting.  No diarrhea no constipation. She denies any fever or chills cough or shortness of breath or sore throat lately as well. She denies any loss of sense or smell. Prior to this she was at her baseline. At her baseline the patient is able to ambulate around the halls without any shortness of breath or chest pain.  She is also able to climb up and down the stairs without any chest pain or shortness of breath. She denies any orthopnea or PND at her baseline.  She denies any prior complication from her surgeries with anesthesia.  Review of Systems: as mentioned in the history of present illness.  All other systems reviewed and are negative.  Past Medical History:  Diagnosis Date  . Bladder cancer ([place]) 2006  . Breast cancer ([place]) 2008   RT LUMPECTOMY  . Personal history of radiation therapy 2008   right breast  . S/P chemotherapy, time since greater than 12 weeks 2006   BLADDER  CA  . S/P radiation > 12 weeks 2008   BREAST CA   Past Surgical History:  Procedure Laterality Date  . BREAST BIOPSY Right 2008   carcinoma  . BREAST LUMPECTOMY Right 2008   carcinoma of breast, SN bx   Social History:  reports that she has never smoked. She has never used smokeless tobacco. She reports that she does not drink alcohol or use drugs.  Allergies  Allergen Reactions  . Codeine Other (See Comments)    Headache  . Mitomycin Other (See Comments)    Blisters  . Mobic [Meloxicam] Other (See Comments)    Blisters   . Lisinopril Rash  . Sulfa Antibiotics Rash   Family history reviewed and not pertinent Family History  Problem Relation Age of Onset  . Asthma Mother   . COPD Sister   . Breast cancer Neg Hx    Prior to Admission medications   Medication Sig Start Date End Date Taking? Authorizing Provider  busPIRone (BUSPAR) 5 MG tablet Take 5 mg by mouth 2 (two) times daily.   Yes [provider]  losartan (COZAAR) 25 MG tablet Take 25 mg by mouth daily.   Yes [provider]  aspirin EC 81 MG EC tablet Take 1 tablet (81 mg total) by mouth daily. 12/13/16   Patteson, Arlan Organ, NP  clobetasol ointment (TEMOVATE) [id number] % Apply 1 application topically 2 (two) times daily as needed (for itching).  10/09/16   [provider]  Coenzyme Q10 (CO  Q 10 PO) Take 1 capsule by mouth every evening.    [provider]  escitalopram (LEXAPRO) 20 MG tablet Take 20 mg by mouth daily. 10/11/16   [provider]  glimepiride (AMARYL) 4 MG tablet Take 4 mg by mouth 2 (two) times daily with a meal. 10/14/16   [provider]  levothyroxine (SYNTHROID) 100 MCG tablet Take 100 mcg by mouth daily.  10/14/16   [provider]  metFORMIN (GLUCOPHAGE) 500 MG tablet Take 1,000 mg by mouth 2 (two) times daily. 10/11/16   [provider]  Omega-3 Fatty Acids (FISH OIL PO) Take 1 capsule by mouth 2 (two) times daily.    [provider]  simvastatin (ZOCOR) 20 MG tablet Take 20 mg by mouth at bedtime. 10/11/16   [provider]   Physical Exam: Vitals:   06/01/19 1345  BP: (!) 100/50  Pulse: 78  Resp: 18  Temp: 98 F (36.7 C)  TempSrc: Oral  SpO2: 98%  Weight: 84 kg  Height: 5\' 6"  (1.676 m)   General: alert and oriented to time, place, and person. Appear in moderate distress, affect appropriate Eyes: PERRL, Conjunctiva normal ENT: Oral Mucosa Clear, moist  Neck: no JVD, no Abnormal Mass Or lumps Cardiovascular: S1 and S2 Present, no Murmur, peripheral pulses symmetrical Respiratory: normal respiratory effort, Bilateral Air entry equal and Decreased, no use of accessory muscle, Clear to Auscultation, no Crackles, no wheezes Abdomen: Bowel Sound present, Soft and no tenderness, no hernia Skin: no rashes  Extremities: no Pedal edema, ankle tenderness Neurologic: without any new focal findings Gait not checked due to patient safety concerns   Labs:  CBC: Recent Labs  Lab 06/01/19 1526  WBC 12.9*  NEUTROABS 9.5*  HGB 12.4  HCT 38.5  MCV 88.9  PLT [id number]   Basic Metabolic Panel: Recent Labs  Lab 06/01/19 1526  NA 141  K 4.5  CL 105  CO2 25  GLUCOSE 153*  BUN 19  CREATININE 0.82  CALCIUM 9.4   Liver Function Tests: Recent Labs  Lab 06/01/19 1526  AST 26  ALT 18  ALKPHOS 52  BILITOT 0.7  PROT 7.1  ALBUMIN 4.4   No results for input(s): LIPASE, AMYLASE in the last 168 hours. No results for input(s): AMMONIA in the last 168 hours.  Cardiac Enzymes: No results for input(s): CKTOTAL, CKMB, CKMBINDEX, TROPONINI in the last 168 hours. No results for input(s): PROBNP in the last 8760 hours.  CBG: No results for input(s): GLUCAP in the last 168 hours.  Radiological Exams: DG Ankle 2 Views Left  Result Date: 06/01/2019 CLINICAL DATA:  Post reduction EXAM: LEFT ANKLE - 2 VIEW COMPARISON:  Radiograph same day FINDINGS: There is been interval reduction of the ankle  dislocation. There is approved alignment of tibiotalar joint with approximately 1 cm of medial clear space widening. Comminuted fracture of the posterior malleolus and medial malleolus is seen. Mildly displaced obliquely oriented distal fibular fracture. IMPRESSION: Interval reduction of tibiotalar dislocation with improved alignment. 1 cm medial clear space widening. Electronically Signed   By: Prudencio Pair M.D.   On: 06/01/2019 16:07   DG Ankle Complete Left  Result Date: 06/01/2019 CLINICAL DATA:  Left ankle pain and deformity after fall today. EXAM: LEFT ANKLE COMPLETE - 3+ VIEW COMPARISON:  None. FINDINGS: Severely displaced and comminuted distal left fibular fracture is noted. Severely displaced fractures are seen involving the medial and lateral portions of the distal tibia. There is severe lateral dislocation  of the talus relative to the tibia. IMPRESSION: Severely displaced and comminuted distal left fibular fracture. Severely displaced fractures are seen involving the medial and lateral portions of the distal tibia with severe lateral dislocation of the talus relative to distal tibia. Electronically Signed   By: Marijo Conception M.D.   On: 06/01/2019 14:38   CT Ankle Left Wo Contrast  Result Date: 06/01/2019 CLINICAL DATA:  Ankle trauma EXAM: CT OF THE LEFT ANKLE WITHOUT CONTRAST TECHNIQUE: Multidetector CT imaging of the left ankle was performed according to the standard protocol. Multiplanar CT image reconstructions were also generated. COMPARISON:  None. FINDINGS: Bones/Joint/Cartilage There is been interval reduction of the ankle dislocation. There is improved alignment of the distal tibia on the talus. However there is still approximately 1.1 cm of medial clear space widening. There is a comminuted mildly displaced medial malleolar fracture with still articulates with the talus. There is comminuted displaced posterior malleolar fracture with small fracture fragment seen in the posterior joint  space. There is slight impaction of the posterior distal tibia with approximately 4 mm of overlying articular surface depression mildly displaced obliquely oriented fracture of the distal fibula seen with small fracture fragments in the syndesmosis. No other fractures are identified. Calcaneal enthesophytes are noted. A small ankle joint effusion is seen. Ligaments Suboptimally assessed by CT. Muscles and Tendons The muscles surrounding the ankle appear to be grossly intact. The flexor and extensor tendons appear to be grossly intact. There is a os navicularae. the Achilles tendon is intact. Soft tissues Diffuse subcutaneous edema and soft tissue swelling seen surrounding the ankle. There is also heel pad inflammation. IMPRESSION: Interval improved alignment of the ankle dislocation, however approximately 1 cm of medial clear space widening. Comminuted medial and posterior malleolar fractures with overlying 4 mm articular surface depression at the distal posterior tibia. Mildly displaced distal fibular fracture. Electronically Signed   By: Prudencio Pair M.D.   On: 06/01/2019 15:57   DG Chest Portable 1 View  Result Date: 06/01/2019 CLINICAL DATA:  Post reduction left ankle EXAM: PORTABLE CHEST 1 VIEW COMPARISON:  None. FINDINGS: The heart size and mediastinal contours are within normal limits. Both lungs are clear. Rounded calcifications seen overlying the right lateral chest wall. No acute osseous abnormality. IMPRESSION: No active disease. Electronically Signed   By: Prudencio Pair M.D.   On: 06/01/2019 16:06   EKG: Independently reviewed. normal sinus rhythm, nonspecific ST and T waves changes.  Assessment/Plan Closed left ankle fracture Presents with a mechanical fall. Sustained to have close left ankle fracture. Dr. Harlow Mares from orthopedic has been consulted Based on the history provided by the patient as well as current medical examination patient does not require medical admission. Inpatient  management by orthopedics  Preoperative medical evaluation No history of coronary revascularization/CVA within 5 years. No recent  stress test. He is able to climb flight of stair, participates in recreational activity,does household chores. No prior adverse event with anesthesia. No alcohol use, drug use.  A) Cardiac risk: Based on RCRI  With this the patient is a low risk for adverse Cardiac outcome from surgery.  Recommend no further work up  B) Pulmonary risk: Continue CPAP. Continue incentive spirometry.  C) Bleeding risk The pt will be place on SCD and surgery will determine DVT prophylaxis   History of TIA (transient ischemic attack) HLD (hyperlipidemia) Continue simvastatin. Continue aspirin.  Type 2 diabetes mellitus with hyperlipidemia (HCC) Controlled. Check hemoglobin A1c. Continue sliding scale insulin. Patient will be  probably n.p.o. after midnight before we will discuss sensitive scale. Holding Metformin and glimepiride.  History of right breast cancer History of bladder cancer Currently in remission.  No active issues. Outpatient follow-up.  Hypothyroidism Continue Synthroid.  OSA on CPAP Continue CPAP at night.  Anxiety and depression. Continue home regimen.  Family Communication: No family at bedside Primary team communication: EDP will discuss with primary team regarding admission  Thank you very much for involving [place] in care of your patient.  We will continue to follow the patient.    Author: Berle Mull, MD [facility] 06/01/2019 4:18 PM    If 7PM-7AM, please contact night-coverage [email or web address]

## 2019-06-01 NOTE — ED Notes (Signed)
Ankle fx reduced by Dr Corky Downs and Letitia Libra PA  Pt tolerated well  Posterior OCL splint applied

## 2019-06-01 NOTE — ED Triage Notes (Signed)
Presents via EMS   S/p fall  States she missed the last 2 steps  States both legs went back under her  Abrasion to right knee  Deformity noted to left ankle  Positive pulses  Was given Fentanyl 100 mcq PTA

## 2019-06-01 NOTE — ED Notes (Signed)
NP messaged about inadequate pain control at this time.

## 2019-06-01 NOTE — Progress Notes (Signed)
Patient started on hydromorphone pca due to pain was inadequately controlled to bolus dosing and staggering of pain meds on previous regimen.  Message sent to Dr. Harlow Mares to inform him of the pain and plan for control

## 2019-06-01 NOTE — ED Provider Notes (Signed)
I supervised PA as she reduced displaced fractures, patient seen by me   Lavonia Drafts, MD 06/01/19 [phone]

## 2019-06-01 NOTE — ED Notes (Signed)
Bill, RN notified about patients blood pressure.

## 2019-06-01 NOTE — ED Provider Notes (Signed)
[facility] Emergency Department Provider Note   ____________________________________________   First MD Initiated Contact with Patient 06/01/19 1347     (approximate)  I have reviewed the triage vital signs and the nursing notes.   HISTORY  Chief Complaint Ankle Pain   HPI Shelia Evans is a 69 y.o. female presents to the ED via EMS after a fall at home.  Patient states she was carrying a box down steps and missed the last 2 steps thinking that she was closer to bottom.  Patient denies any head injury or loss of consciousness.  She states that she felt her left ankle pop as she had pain immediately.  She she is under the impression that possibly her leg went behind her.  Patient denies any previous injury to her ankle.  She denies any other injuries.  Patient was given fentanyl 100 mg while in route via EMS.  She rates her pain as 7 out of 10.  Patient reports that she has had the first series of her Covid vaccine.     Past Medical History:  Diagnosis Date  . Bladder cancer ([place]) 2006  . Breast cancer ([place]) 2008   RT LUMPECTOMY  . Personal history of radiation therapy 2008   right breast  . S/P chemotherapy, time since greater than 12 weeks 2006   BLADDER CA  . S/P radiation > 12 weeks 2008   BREAST CA    Patient Active Problem List   Diagnosis Date Noted  . Cerebral thrombosis with cerebral infarction - TIA vs. Seizure - presentation atypical for TIA or stroke, suspicious for seizure but can not rule out TIA due to stroke risk factors including DM, HLD 12/10/2016  . 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 BREAST BIOPSY Right 2008   carcinoma  . BREAST LUMPECTOMY Right 2008   carcinoma of breast, SN bx    Prior to Admission medications   Medication Sig Start Date End Date Taking? Authorizing Provider  busPIRone (BUSPAR) 5 MG tablet Take 5 mg by mouth 2 (two) times daily.   Yes  [provider]  losartan (COZAAR) 25 MG tablet Take 25 mg by mouth daily.   Yes [provider]  aspirin EC 81 MG EC tablet Take 1 tablet (81 mg total) by mouth daily. 12/13/16   Patteson, Arlan Organ, NP  clobetasol ointment (TEMOVATE) [id number] % Apply 1 application topically 2 (two) times daily as needed (for itching).  10/09/16   [provider]  Coenzyme Q10 (CO Q 10 PO) Take 1 capsule by mouth every evening.    [provider]  escitalopram (LEXAPRO) 20 MG tablet Take 20 mg by mouth daily. 10/11/16   [provider]  glimepiride (AMARYL) 4 MG tablet Take 4 mg by mouth 2 (two) times daily with a meal. 10/14/16   [provider]  levothyroxine (SYNTHROID) 100 MCG tablet Take 100 mcg by mouth daily.  10/14/16   [provider]  metFORMIN (GLUCOPHAGE) 500 MG tablet Take 1,000 mg by mouth 2 (two) times daily. 10/11/16   [provider]  Omega-3 Fatty Acids (FISH OIL PO) Take 1 capsule by mouth 2 (two) times daily.    [provider]  simvastatin (ZOCOR) 20 MG tablet Take 20 mg by mouth at bedtime. 10/11/16   [provider]    Allergies Codeine, Mitomycin, Mobic [meloxicam], Lisinopril, and Sulfa antibiotics  Family History  Problem Relation  Age of Onset  . Asthma Mother   . COPD Sister   . Breast cancer Neg Hx     Social History Social History   Tobacco Use  . Smoking status: Never Smoker  . Smokeless tobacco: Never Used  Substance Use Topics  . Alcohol use: No  . Drug use: No    Review of Systems Constitutional: No fever/chills Eyes: No visual changes. ENT: No trauma. Cardiovascular: Denies chest pain. Respiratory: Denies shortness of breath. Gastrointestinal: No abdominal pain.  No nausea, no vomiting.  Musculoskeletal: Positive left ankle pain. Skin: Negative for rash. Neurological: Negative for headaches, focal weakness or numbness. ____________________________________________   PHYSICAL  EXAM:  VITAL SIGNS: ED Triage Vitals [06/01/19 1345]  Enc Vitals Group     BP (!) 100/50     Pulse Rate 78     Resp 18     Temp 98 F (36.7 C)     Temp Source Oral     SpO2 98 %     Weight 185 lb 3 oz (84 kg)     Height 5\' 6"  (1.676 m)     Head Circumference      Peak Flow      Pain Score 7     Pain Loc      Pain Edu?      Excl. in [place]?     Constitutional: Alert and oriented. Well appearing and in no acute distress. Eyes: Conjunctivae are normal.  Head: Atraumatic. Neck: No stridor.   Nontender cervical spine to palpation posteriorly. Cardiovascular: Normal rate, regular rhythm. Grossly normal heart sounds.  Good peripheral circulation. Respiratory: Normal respiratory effort.  No retractions. Lungs CTAB. Gastrointestinal: Soft and nontender. No distention. Musculoskeletal: On examination of left ankle there is moderate edema with deformity and foot is angled laterally.  Skin is intact.  Pulses are present.  Motor sensory function intact distal to the injury.  Nontender palpation proximal tibia, patella or compression of the pelvis.  Patient is able to move right lower extremity without any difficulty. Neurologic:  Normal speech and language. No gross focal neurologic deficits are appreciated.  Skin:  Skin is warm, dry and intact.  Psychiatric: Mood and affect are normal. Speech and behavior are normal.  ____________________________________________   LABS (all labs ordered are listed, but only abnormal results are displayed)  Labs Reviewed  CBC WITH DIFFERENTIAL/PLATELET - Abnormal; Notable for the following components:      Result Value   WBC 12.9 (*)    Neutro Abs 9.5 (*)    All other components within normal limits  COMPREHENSIVE METABOLIC PANEL - Abnormal; Notable for the following components:   Glucose, Bld 153 (*)    All other components within normal limits  SARS CORONAVIRUS 2 (TAT 6-24 HRS)  POC SARS CORONAVIRUS 2 AG -  ED    ____________________________________________  EKG  EKG was reviewed by Dr. Corky Downs. ____________________________________________  [place]  Official radiology report(s): DG Ankle 2 Views Left  Result Date: 06/01/2019 CLINICAL DATA:  Post reduction EXAM: LEFT ANKLE - 2 VIEW COMPARISON:  Radiograph same day FINDINGS: There is been interval reduction of the ankle dislocation. There is approved alignment of tibiotalar joint with approximately 1 cm of medial clear space widening. Comminuted fracture of the posterior malleolus and medial malleolus is seen. Mildly displaced obliquely oriented distal fibular fracture. IMPRESSION: Interval reduction of tibiotalar dislocation with improved alignment. 1 cm medial clear space widening. Electronically Signed   By: Prudencio Pair M.D.   On:  06/01/2019 16:07   DG Ankle Complete Left  Result Date: 06/01/2019 CLINICAL DATA:  Left ankle pain and deformity after fall today. EXAM: LEFT ANKLE COMPLETE - 3+ VIEW COMPARISON:  None. FINDINGS: Severely displaced and comminuted distal left fibular fracture is noted. Severely displaced fractures are seen involving the medial and lateral portions of the distal tibia. There is severe lateral dislocation of the talus relative to the tibia. IMPRESSION: Severely displaced and comminuted distal left fibular fracture. Severely displaced fractures are seen involving the medial and lateral portions of the distal tibia with severe lateral dislocation of the talus relative to distal tibia. Electronically Signed   By: Marijo Conception M.D.   On: 06/01/2019 14:38   CT Ankle Left Wo Contrast  Result Date: 06/01/2019 CLINICAL DATA:  Ankle trauma EXAM: CT OF THE LEFT ANKLE WITHOUT CONTRAST TECHNIQUE: Multidetector CT imaging of the left ankle was performed according to the standard protocol. Multiplanar CT image reconstructions were also generated. COMPARISON:  None. FINDINGS: Bones/Joint/Cartilage There is been interval reduction of the  ankle dislocation. There is improved alignment of the distal tibia on the talus. However there is still approximately 1.1 cm of medial clear space widening. There is a comminuted mildly displaced medial malleolar fracture with still articulates with the talus. There is comminuted displaced posterior malleolar fracture with small fracture fragment seen in the posterior joint space. There is slight impaction of the posterior distal tibia with approximately 4 mm of overlying articular surface depression mildly displaced obliquely oriented fracture of the distal fibula seen with small fracture fragments in the syndesmosis. No other fractures are identified. Calcaneal enthesophytes are noted. A small ankle joint effusion is seen. Ligaments Suboptimally assessed by CT. Muscles and Tendons The muscles surrounding the ankle appear to be grossly intact. The flexor and extensor tendons appear to be grossly intact. There is a os navicularae. the Achilles tendon is intact. Soft tissues Diffuse subcutaneous edema and soft tissue swelling seen surrounding the ankle. There is also heel pad inflammation. IMPRESSION: Interval improved alignment of the ankle dislocation, however approximately 1 cm of medial clear space widening. Comminuted medial and posterior malleolar fractures with overlying 4 mm articular surface depression at the distal posterior tibia. Mildly displaced distal fibular fracture. Electronically Signed   By: Prudencio Pair M.D.   On: 06/01/2019 15:57   DG Chest Portable 1 View  Result Date: 06/01/2019 CLINICAL DATA:  Post reduction left ankle EXAM: PORTABLE CHEST 1 VIEW COMPARISON:  None. FINDINGS: The heart size and mediastinal contours are within normal limits. Both lungs are clear. Rounded calcifications seen overlying the right lateral chest wall. No acute osseous abnormality. IMPRESSION: No active disease. Electronically Signed   By: Prudencio Pair M.D.   On: 06/01/2019 16:06     ____________________________________________   PROCEDURES  Procedure(s) performed (including Critical Care):  .Ortho Injury Treatment  Date/Time: 06/01/2019 3:26 PM Performed by: Johnn Hai, PA-C Authorized by: Johnn Hai, PA-C   Consent:    Consent obtained:  Verbal   Consent given by:  Patient   Risks discussed:  FractureInjury location: ankle Location details: left ankle Injury type: fracture-dislocation Fracture type: bimalleolar Pre-procedure neurovascular assessment: neurovascularly intact Pre-procedure distal perfusion: normal Pre-procedure neurological function: normal Pre-procedure range of motion: reduced  Anesthesia: Local anesthesia used: no  Patient sedated: NoManipulation performed: yes Skeletal traction used: yes Reduction successful: yes Immobilization: splint Splint type: long leg Supplies used: Ortho-Glass and elastic bandage Post-procedure neurovascular assessment: post-procedure neurovascularly intact Post-procedure distal perfusion: normal  Post-procedure neurological function: normal Post-procedure range of motion: unchanged    ____________________________________________   INITIAL IMPRESSION / ASSESSMENT AND PLAN / ED COURSE  As part of my medical decision making, I reviewed the following data within the electronic MEDICAL RECORD NUMBER Notes from prior ED visits and [place] Controlled Substance Database  69 year old female presents to the ED via EMS after she fell down some steps at home.  Patient states that she missed the last 1 or 2 steps at her home while carrying a box.  Patient denies any head injury or loss of consciousness.  Physical exam is consistent with a fracture possible dislocation of her left ankle.  X-rays confirmed that she has a displaced comminuted fracture of the distal fibula and distal tibia with displacement of the talus.  Patient was given pain medication IV while in the ED.  Reduction was performed by Dr. Corky Downs  and myself and a posterior OCL was applied.  Arrangements are being made for patient to be admitted and surgery is planned for tomorrow by Dr. Harlow Mares.    ____________________________________________   FINAL CLINICAL IMPRESSION(S) / ED DIAGNOSES  Final diagnoses:  Closed fracture dislocation of left ankle, initial encounter  Fall at home, initial encounter     ED Discharge Orders    None       Note:  This document was prepared using Dragon voice recognition software and may include unintentional dictation errors.    Johnn Hai, PA-C 06/01/19 1614    Lavonia Drafts, MD 06/02/19 [id number]

## 2019-06-01 NOTE — ED Notes (Signed)
pts ankle repositioned at this time

## 2019-06-02 ENCOUNTER — Inpatient Hospital Stay: Payer: Medicare HMO | Admitting: Certified Registered Nurse Anesthetist

## 2019-06-02 ENCOUNTER — Encounter
Admission: EM | Disposition: A | Discharge status: Skilled Nursing Facility | Payer: Self-pay | Source: Home / Self Care | Attending: Orthopedic Surgery

## 2019-06-02 ENCOUNTER — Inpatient Hospital Stay: Payer: Medicare HMO

## 2019-06-02 ENCOUNTER — Encounter: Payer: Self-pay | Admitting: Orthopedic Surgery

## 2019-06-02 HISTORY — PX: ORIF ANKLE FRACTURE: SHX5408

## 2019-06-02 LAB — GLUCOSE, CAPILLARY
Glucose-Capillary: 118 mg/dL — ABNORMAL HIGH (ref 70–99)
Glucose-Capillary: 131 mg/dL — ABNORMAL HIGH (ref 70–99)
Glucose-Capillary: 139 mg/dL — ABNORMAL HIGH (ref 70–99)
Glucose-Capillary: 161 mg/dL — ABNORMAL HIGH (ref 70–99)
Glucose-Capillary: 194 mg/dL — ABNORMAL HIGH (ref 70–99)
Glucose-Capillary: 237 mg/dL — ABNORMAL HIGH (ref 70–99)

## 2019-06-02 LAB — SARS CORONAVIRUS 2 (TAT 6-24 HRS): SARS Coronavirus 2: NEGATIVE

## 2019-06-02 LAB — HEMOGLOBIN A1C
Hgb A1c MFr Bld: 7.3 % — ABNORMAL HIGH (ref 4.8–5.6)
Mean Plasma Glucose: 162.81 mg/dL

## 2019-06-02 SURGERY — OPEN REDUCTION INTERNAL FIXATION (ORIF) ANKLE FRACTURE
Anesthesia: General | Site: Ankle | Laterality: Left

## 2019-06-02 MED ORDER — PROMETHAZINE HCL 25 MG/ML IJ SOLN: 6.2500 mg | INTRAMUSCULAR | Status: DC | PRN

## 2019-06-02 MED ORDER — MIDAZOLAM HCL 2 MG/2ML IJ SOLN
INTRAMUSCULAR | Status: AC
  Administered 2019-06-02: 12:00:00 1 mg via INTRAVENOUS
  Filled 2019-06-02: qty 2

## 2019-06-02 MED ORDER — CEFAZOLIN SODIUM-DEXTROSE 2-4 GM/100ML-% IV SOLN
2.0000 g | INTRAVENOUS | Status: AC
  Administered 2019-06-02: 2 g via INTRAVENOUS
  Filled 2019-06-02: qty 100

## 2019-06-02 MED ORDER — METOCLOPRAMIDE HCL 10 MG PO TABS: 5.0000 mg | ORAL_TABLET | Freq: Three times a day (TID) | ORAL | Status: DC | PRN

## 2019-06-02 MED ORDER — PHENYLEPHRINE HCL (PRESSORS) 10 MG/ML IV SOLN
INTRAVENOUS | Status: DC | PRN
  Administered 2019-06-02 (×2): 100 ug via INTRAVENOUS

## 2019-06-02 MED ORDER — FENTANYL CITRATE (PF) 100 MCG/2ML IJ SOLN
INTRAMUSCULAR | Status: AC
  Filled 2019-06-02: qty 2

## 2019-06-02 MED ORDER — SODIUM CHLORIDE 0.9 % IR SOLN
Status: DC | PRN
  Administered 2019-06-02: 1000 mL

## 2019-06-02 MED ORDER — MIDAZOLAM HCL 2 MG/2ML IJ SOLN
INTRAMUSCULAR | Status: DC | PRN
  Administered 2019-06-02: 2 mg via INTRAVENOUS

## 2019-06-02 MED ORDER — SUCCINYLCHOLINE CHLORIDE 20 MG/ML IJ SOLN
INTRAMUSCULAR | Status: DC | PRN
  Administered 2019-06-02: 100 mg via INTRAVENOUS

## 2019-06-02 MED ORDER — METOCLOPRAMIDE HCL 5 MG/ML IJ SOLN: 5.0000 mg | Freq: Three times a day (TID) | INTRAMUSCULAR | Status: DC | PRN

## 2019-06-02 MED ORDER — FLUMAZENIL 0.5 MG/5ML IV SOLN
INTRAVENOUS | Status: DC | PRN
  Administered 2019-06-02: .2 mg via INTRAVENOUS

## 2019-06-02 MED ORDER — SENNA 8.6 MG PO TABS
1.0000 | ORAL_TABLET | Freq: Two times a day (BID) | ORAL | Status: DC
  Administered 2019-06-02 – 2019-06-06 (×10): 8.6 mg via ORAL
  Filled 2019-06-02 (×11): qty 1

## 2019-06-02 MED ORDER — FENTANYL CITRATE (PF) 100 MCG/2ML IJ SOLN: 25.0000 ug | INTRAMUSCULAR | Status: DC | PRN

## 2019-06-02 MED ORDER — LIDOCAINE HCL (PF) 1 % IJ SOLN
INTRAMUSCULAR | Status: AC
  Filled 2019-06-02: qty 5

## 2019-06-02 MED ORDER — ESCITALOPRAM OXALATE 10 MG PO TABS
20.0000 mg | ORAL_TABLET | Freq: Every day | ORAL | Status: DC
  Administered 2019-06-03 – 2019-06-07 (×5): 20 mg via ORAL
  Filled 2019-06-02 (×6): qty 2

## 2019-06-02 MED ORDER — SODIUM CHLORIDE 0.9 % IV SOLN
INTRAVENOUS | Status: DC | PRN
  Administered 2019-06-02: 25 ug/min via INTRAVENOUS

## 2019-06-02 MED ORDER — MIDAZOLAM HCL 2 MG/2ML IJ SOLN: 1.0000 mg | Freq: Once | INTRAMUSCULAR | Status: AC

## 2019-06-02 MED ORDER — LIDOCAINE HCL (PF) 1 % IJ SOLN
INTRAMUSCULAR | Status: DC | PRN
  Administered 2019-06-02: 3 mg

## 2019-06-02 MED ORDER — ACETAMINOPHEN 500 MG PO TABS
500.0000 mg | ORAL_TABLET | Freq: Four times a day (QID) | ORAL | Status: DC
  Administered 2019-06-02: 02:00:00 500 mg via ORAL
  Filled 2019-06-02: qty 1

## 2019-06-02 MED ORDER — DOCUSATE SODIUM 100 MG PO CAPS
100.0000 mg | ORAL_CAPSULE | Freq: Two times a day (BID) | ORAL | Status: DC
  Administered 2019-06-02 – 2019-06-06 (×9): 100 mg via ORAL
  Filled 2019-06-02 (×9): qty 1

## 2019-06-02 MED ORDER — SUGAMMADEX SODIUM 200 MG/2ML IV SOLN
INTRAVENOUS | Status: DC | PRN
  Administered 2019-06-02: 168 mg via INTRAVENOUS

## 2019-06-02 MED ORDER — LIDOCAINE HCL (CARDIAC) PF 100 MG/5ML IV SOSY
PREFILLED_SYRINGE | INTRAVENOUS | Status: DC | PRN
  Administered 2019-06-02: 100 mg via INTRAVENOUS

## 2019-06-02 MED ORDER — BACITRACIN 50000 UNITS IM SOLR
INTRAMUSCULAR | Status: AC
  Filled 2019-06-02: qty 1

## 2019-06-02 MED ORDER — OXYCODONE HCL 5 MG PO TABS: 5.0000 mg | ORAL_TABLET | Freq: Once | ORAL | Status: DC | PRN

## 2019-06-02 MED ORDER — MAGNESIUM HYDROXIDE 400 MG/5ML PO SUSP
30.0000 mL | Freq: Every day | ORAL | Status: DC | PRN
  Administered 2019-06-06: 04:00:00 30 mL via ORAL
  Filled 2019-06-02: qty 30

## 2019-06-02 MED ORDER — ONDANSETRON HCL 4 MG/2ML IJ SOLN: 4.0000 mg | Freq: Four times a day (QID) | INTRAMUSCULAR | Status: DC | PRN

## 2019-06-02 MED ORDER — BUSPIRONE HCL 10 MG PO TABS
5.0000 mg | ORAL_TABLET | Freq: Two times a day (BID) | ORAL | Status: DC
  Administered 2019-06-02 – 2019-06-07 (×10): 5 mg via ORAL
  Filled 2019-06-02 (×10): qty 1

## 2019-06-02 MED ORDER — ROCURONIUM BROMIDE 100 MG/10ML IV SOLN
INTRAVENOUS | Status: DC | PRN
  Administered 2019-06-02: 50 mg via INTRAVENOUS

## 2019-06-02 MED ORDER — ACETAMINOPHEN 325 MG PO TABS: 325.0000 mg | ORAL_TABLET | Freq: Four times a day (QID) | ORAL | Status: DC | PRN

## 2019-06-02 MED ORDER — ROPIVACAINE HCL 5 MG/ML IJ SOLN
INTRAMUSCULAR | Status: DC | PRN
  Administered 2019-06-02: 25 mg via PERINEURAL
  Administered 2019-06-02: 5 mg via PERINEURAL

## 2019-06-02 MED ORDER — ROPIVACAINE HCL 5 MG/ML IJ SOLN
INTRAMUSCULAR | Status: AC
  Filled 2019-06-02: qty 30

## 2019-06-02 MED ORDER — PROPOFOL 10 MG/ML IV BOLUS
INTRAVENOUS | Status: DC | PRN
  Administered 2019-06-02: 100 mg via INTRAVENOUS

## 2019-06-02 MED ORDER — DEXAMETHASONE SODIUM PHOSPHATE 10 MG/ML IJ SOLN
INTRAMUSCULAR | Status: DC | PRN
  Administered 2019-06-02: 10 mg via INTRAVENOUS

## 2019-06-02 MED ORDER — FENTANYL CITRATE (PF) 100 MCG/2ML IJ SOLN: 50.0000 ug | Freq: Once | INTRAMUSCULAR | Status: AC

## 2019-06-02 MED ORDER — ACETAMINOPHEN 500 MG PO TABS
1000.0000 mg | ORAL_TABLET | Freq: Three times a day (TID) | ORAL | Status: DC
  Administered 2019-06-02 – 2019-06-07 (×16): 1000 mg via ORAL
  Filled 2019-06-02 (×16): qty 2

## 2019-06-02 MED ORDER — FLUMAZENIL 0.5 MG/5ML IV SOLN
INTRAVENOUS | Status: AC
  Filled 2019-06-02: qty 5

## 2019-06-02 MED ORDER — MIDAZOLAM HCL 2 MG/2ML IJ SOLN
INTRAMUSCULAR | Status: AC
  Filled 2019-06-02: qty 2

## 2019-06-02 MED ORDER — BISACODYL 10 MG RE SUPP
10.0000 mg | Freq: Every day | RECTAL | Status: DC | PRN
  Administered 2019-06-07: 05:00:00 10 mg via RECTAL
  Filled 2019-06-02 (×3): qty 1

## 2019-06-02 MED ORDER — LACTATED RINGERS IV SOLN: INTRAVENOUS | Status: DC

## 2019-06-02 MED ORDER — LIDOCAINE HCL (PF) 2 % IJ SOLN
INTRAMUSCULAR | Status: AC
  Filled 2019-06-02: qty 10

## 2019-06-02 MED ORDER — SIMVASTATIN 20 MG PO TABS
20.0000 mg | ORAL_TABLET | Freq: Every day | ORAL | Status: DC
  Administered 2019-06-02 – 2019-06-06 (×6): 20 mg via ORAL
  Filled 2019-06-02 (×6): qty 1

## 2019-06-02 MED ORDER — EPINEPHRINE PF 1 MG/ML IJ SOLN
INTRAMUSCULAR | Status: AC
  Filled 2019-06-02: qty 1

## 2019-06-02 MED ORDER — MEPERIDINE HCL 50 MG/ML IJ SOLN: 6.2500 mg | INTRAMUSCULAR | Status: DC | PRN

## 2019-06-02 MED ORDER — ONDANSETRON HCL 4 MG PO TABS: 4.0000 mg | ORAL_TABLET | Freq: Four times a day (QID) | ORAL | Status: DC | PRN

## 2019-06-02 MED ORDER — OXYCODONE HCL 5 MG/5ML PO SOLN: 5.0000 mg | Freq: Once | ORAL | Status: DC | PRN

## 2019-06-02 MED ORDER — HYDROMORPHONE HCL 1 MG/ML IJ SOLN
1.0000 mg | INTRAMUSCULAR | Status: DC | PRN
  Administered 2019-06-02 (×3): 1 mg via INTRAVENOUS
  Filled 2019-06-02 (×3): qty 1

## 2019-06-02 MED ORDER — DOCUSATE SODIUM 100 MG PO CAPS
100.0000 mg | ORAL_CAPSULE | Freq: Two times a day (BID) | ORAL | Status: DC
  Administered 2019-06-02 – 2019-06-06 (×5): 100 mg via ORAL
  Filled 2019-06-02 (×8): qty 1

## 2019-06-02 MED ORDER — FENTANYL CITRATE (PF) 100 MCG/2ML IJ SOLN
INTRAMUSCULAR | Status: AC
  Administered 2019-06-02: 12:00:00 50 ug via INTRAVENOUS
  Filled 2019-06-02: qty 2

## 2019-06-02 MED ORDER — OXYCODONE HCL 5 MG PO TABS
10.0000 mg | ORAL_TABLET | ORAL | Status: DC | PRN
  Administered 2019-06-02 – 2019-06-07 (×10): 10 mg via ORAL
  Filled 2019-06-02 (×10): qty 2

## 2019-06-02 MED ORDER — PROPOFOL 10 MG/ML IV BOLUS
INTRAVENOUS | Status: AC
  Filled 2019-06-02: qty 20

## 2019-06-02 MED ORDER — LEVOTHYROXINE SODIUM 100 MCG PO TABS
100.0000 ug | ORAL_TABLET | Freq: Every day | ORAL | Status: DC
  Administered 2019-06-02 – 2019-06-07 (×6): 100 ug via ORAL
  Filled 2019-06-02 (×6): qty 1

## 2019-06-02 MED ORDER — BUPIVACAINE HCL (PF) 0.25 % IJ SOLN
INTRAMUSCULAR | Status: AC
  Filled 2019-06-02: qty 30

## 2019-06-02 SURGICAL SUPPLY — 55 items
BIT DRILL 2.5 X LONG (BIT) ×1
BIT DRILL CANN 2.7X625 NONSTRL (BIT) ×6 IMPLANT
BIT DRILL LCP QC 2X140 (BIT) ×3 IMPLANT
BIT DRILL X LONG 2.5 (BIT) ×1 IMPLANT
BNDG COHESIVE 6X5 TAN STRL LF (GAUZE/BANDAGES/DRESSINGS) ×3 IMPLANT
BNDG ELASTIC 6X5.8 VLCR STR LF (GAUZE/BANDAGES/DRESSINGS) ×3 IMPLANT
BNDG ESMARK 6X12 TAN STRL LF (GAUZE/BANDAGES/DRESSINGS) ×3 IMPLANT
BRUSH SCRUB EZ  4% CHG (MISCELLANEOUS) ×4
BRUSH SCRUB EZ 4% CHG (MISCELLANEOUS) ×2 IMPLANT
CANISTER SUCT 1200ML W/VALVE (MISCELLANEOUS) ×3 IMPLANT
CHLORAPREP W/TINT 26 (MISCELLANEOUS) ×6 IMPLANT
COVER WAND RF STERILE (DRAPES) ×3 IMPLANT
CUFF TOURN SGL QUICK 24 (TOURNIQUET CUFF) ×2
CUFF TOURN SGL QUICK 30 (TOURNIQUET CUFF)
CUFF TRNQT CYL 24X4X16.5-23 (TOURNIQUET CUFF) ×1 IMPLANT
CUFF TRNQT CYL 30X4X21-28X (TOURNIQUET CUFF) IMPLANT
DRAPE 3/4 80X56 (DRAPES) ×3 IMPLANT
DRAPE FLUOR MINI C-ARM 54X84 (DRAPES) ×3 IMPLANT
DRILL BIT X LONG 2.5 (BIT) ×2
ELECT REM PT RETURN 9FT ADLT (ELECTROSURGICAL) ×3
ELECTRODE REM PT RTRN 9FT ADLT (ELECTROSURGICAL) ×1 IMPLANT
GAUZE XEROFORM 1X8 LF (GAUZE/BANDAGES/DRESSINGS) ×6 IMPLANT
GLOVE INDICATOR 8.0 STRL GRN (GLOVE) ×9 IMPLANT
GLOVE SURG ORTHO 8.0 STRL STRW (GLOVE) ×9 IMPLANT
GOWN STRL REUS W/ TWL LRG LVL3 (GOWN DISPOSABLE) ×1 IMPLANT
GOWN STRL REUS W/ TWL XL LVL3 (GOWN DISPOSABLE) ×1 IMPLANT
GOWN STRL REUS W/TWL LRG LVL3 (GOWN DISPOSABLE) ×2
GOWN STRL REUS W/TWL XL LVL3 (GOWN DISPOSABLE) ×2
GUIDEWARE NON THREAD 1.25X150 (WIRE) ×3
GUIDEWIRE NON THREAD 1.25X150 (WIRE) ×1 IMPLANT
K-WIRE 1.6X150 (WIRE) ×3
KIT TURNOVER KIT A (KITS) ×3 IMPLANT
KWIRE 1.6X150 (WIRE) ×1 IMPLANT
NS IRRIG 1000ML POUR BTL (IV SOLUTION) ×3 IMPLANT
PACK EXTREMITY ARMC (MISCELLANEOUS) ×3 IMPLANT
PAD ABD DERMACEA PRESS 5X9 (GAUZE/BANDAGES/DRESSINGS) ×12 IMPLANT
PAD CAST CTTN 4X4 STRL (SOFTGOODS) ×2 IMPLANT
PADDING CAST COTTON 4X4 STRL (SOFTGOODS) ×4
PLATE BONE LOCK COMP 6 H 118MM (Plate) ×3 IMPLANT
SCALPEL PROTECTED #10 DISP (BLADE) ×3 IMPLANT
SCALPEL PROTECTED #15 DISP (BLADE) ×3 IMPLANT
SCREW CANN S THRD/44 4.0 (Screw) ×6 IMPLANT
SCREW CORTEX PROFILE 3.5X40 (Screw) ×3 IMPLANT
SCREW LOCKING 2.7X16MM VA (Screw) ×12 IMPLANT
SCREW SELF TAP 14MM (Screw) ×12 IMPLANT
SPLINT CAST 1 STEP 5X30 WHT (MISCELLANEOUS) ×3 IMPLANT
SPONGE LAP 18X18 RF (DISPOSABLE) ×3 IMPLANT
STAPLER SKIN PROX 35W (STAPLE) ×3 IMPLANT
SUT VIC AB 2-0 SH 27 (SUTURE) ×2
SUT VIC AB 2-0 SH 27XBRD (SUTURE) ×1 IMPLANT
SUT VIC AB 3-0 SH 27 (SUTURE) ×2
SUT VIC AB 3-0 SH 27X BRD (SUTURE) ×1 IMPLANT
TAPE SURG TRANSPORE 1 IN (GAUZE/BANDAGES/DRESSINGS) ×1 IMPLANT
TAPE SURGICAL TRANSPORE 1 IN (GAUZE/BANDAGES/DRESSINGS) ×2
TOWEL OR 17X26 4PK STRL BLUE (TOWEL DISPOSABLE) ×6 IMPLANT

## 2019-06-02 NOTE — Transfer of Care (Signed)
Immediate Anesthesia Transfer of Care Note  Patient: Shelia Evans  Procedure(s) Performed: OPEN REDUCTION INTERNAL FIXATION (ORIF) ANKLE FRACTURE (Left Ankle)  Patient Location: PACU  Anesthesia Type:General  Level of Consciousness: sedated  Airway & Oxygen Therapy: Patient Spontanous Breathing  Post-op Assessment: Report given to RN and Post -op Vital signs reviewed and stable  Post vital signs: Reviewed and stable  Last Vitals:  Vitals Value Taken Time  BP 115/56 06/02/19 1524  Temp    Pulse 83 06/02/19 1526  Resp 11 06/02/19 1526  SpO2 91 % 06/02/19 1526  Vitals shown include unvalidated device data.  Last Pain:  Vitals:   06/02/19 1219  TempSrc:   PainSc: 0-No pain      Patients Stated Pain Goal: 2 ([id number])  Complications: No apparent anesthesia complications

## 2019-06-02 NOTE — Plan of Care (Signed)
Patient denies respiratory complications

## 2019-06-02 NOTE — Anesthesia Postprocedure Evaluation (Signed)
Anesthesia Post Note  Patient: Shelia Evans  Procedure(s) Performed: OPEN REDUCTION INTERNAL FIXATION (ORIF) ANKLE FRACTURE (Left Ankle)  Patient location during evaluation: PACU Anesthesia Type: General Level of consciousness: awake and alert Pain management: pain level controlled Vital Signs Assessment: post-procedure vital signs reviewed and stable Respiratory status: spontaneous breathing, nonlabored ventilation, respiratory function stable and patient connected to nasal cannula oxygen Cardiovascular status: blood pressure returned to baseline and stable Postop Assessment: no apparent nausea or vomiting Anesthetic complications: no     Last Vitals:  Vitals:   06/02/19 1742 06/02/19 1832  BP: (!) 106/55 110/61  Pulse: 78 82  Resp: 17 17  Temp:    SpO2: 95% 93%    Last Pain:  Vitals:   06/02/19 1700  TempSrc: Oral  PainSc:                  Martha Clan

## 2019-06-02 NOTE — Anesthesia Procedure Notes (Signed)
Anesthesia Regional Block: Popliteal block   Pre-Anesthetic Checklist: ,, timeout performed, Correct Patient, Correct Site, Correct Laterality, Correct Procedure, Correct Position, site marked, Risks and benefits discussed,  Surgical consent,  Pre-op evaluation,  At surgeon's request and post-op pain management  Laterality: Left  Prep: chloraprep       Needles:  Injection technique: Single-shot  Needle Type: Stimiplex     Needle Length: 10cm  Needle Gauge: 21     Additional Needles:   Procedures:,,,, ultrasound used (permanent image in chart),,,,  Narrative:  Start time: 06/02/2019 12:12 PM End time: 06/02/2019 12:18 PM Injection made incrementally with aspirations every 5 mL.  Performed by: Personally  Anesthesiologist: Emmie Niemann, MD  Additional Notes: Functioning IV was confirmed and monitors were applied.  A stimuplex needle was used. Sterile prep and drape,hand hygiene and sterile gloves were used.  Negative aspiration and negative test dose prior to incremental administration of local anesthetic. The patient tolerated the procedure well.   Supplemental saphenous block also performed

## 2019-06-02 NOTE — Anesthesia Procedure Notes (Signed)
Procedure Name: Intubation Date/Time: 06/02/2019 1:38 PM Performed by: Nelda Marseille, CRNA Pre-anesthesia Checklist: Patient identified, Patient being monitored, Timeout performed, Emergency Drugs available and Suction available Patient Re-evaluated:Patient Re-evaluated prior to induction Oxygen Delivery Method: Circle system utilized Preoxygenation: Pre-oxygenation with 100% oxygen Induction Type: IV induction Ventilation: Mask ventilation without difficulty Laryngoscope Size: Mac, 3 and McGraph Grade View: Grade I Tube type: Oral Tube size: 7.0 mm Number of attempts: 1 Airway Equipment and Method: Stylet and Video-laryngoscopy Placement Confirmation: ETT inserted through vocal cords under direct vision,  positive ETCO2 and breath sounds checked- equal and bilateral Secured at: 21 cm Tube secured with: Tape Dental Injury: Teeth and Oropharynx as per pre-operative assessment

## 2019-06-02 NOTE — H&P (Signed)
PREOPERATIVE H&P  Chief Complaint: Left Ankle Fracture  HPI: Shelia Evans is a 69 y.o. female who presents for preoperative history and physical with a diagnosis of Left Ankle Fracture. Symptoms are rated as moderate to severe, and have been worsening.  This is significantly impairing activities of daily living.  She has elected for surgical management.   Past Medical History:  Diagnosis Date  . Bladder cancer ([place]) 2006  . Breast cancer ([place]) 2008   RT LUMPECTOMY  . Personal history of radiation therapy 2008   right breast  . S/P chemotherapy, time since greater than 12 weeks 2006   BLADDER CA  . S/P radiation > 12 weeks 2008   BREAST CA   Past Surgical History:  Procedure Laterality Date  . BREAST BIOPSY Right 2008   carcinoma  . BREAST LUMPECTOMY Right 2008   carcinoma of breast, SN bx   Social History   Socioeconomic History  . Marital status: Single    Spouse name: Not on file  . Number of children: Not on file  . Years of education: Not on file  . Highest education level: Not on file  Occupational History  . Not on file  Tobacco Use  . Smoking status: Never Smoker  . Smokeless tobacco: Never Used  Substance and Sexual Activity  . Alcohol use: No  . Drug use: No  . Sexual activity: Not on file  Other Topics Concern  . Not on file  Social History Narrative  . Not on file   Social Determinants of Health   Financial Resource Strain:   . Difficulty of Paying Living Expenses: Not on file  Food Insecurity:   . Worried About Charity fundraiser in the Last Year: Not on file  . Ran Out of Food in the Last Year: Not on file  Transportation Needs:   . Lack of Transportation (Medical): Not on file  . Lack of Transportation (Non-Medical): Not on file  Physical Activity:   . Days of Exercise per Week: Not on file  . Minutes of Exercise per Session: Not on file  Stress:   . Feeling of Stress : Not on file  Social Connections:   . Frequency of Communication  with Friends and Family: Not on file  . Frequency of Social Gatherings with Friends and Family: Not on file  . Attends Religious Services: Not on file  . Active Member of Clubs or Organizations: Not on file  . Attends Archivist Meetings: Not on file  . Marital Status: Not on file   Family History  Problem Relation Age of Onset  . Asthma Mother   . COPD Sister   . Breast cancer Neg Hx    Allergies  Allergen Reactions  . Lisinopril Rash  . Meloxicam Other (See Comments) and Hives    Blisters  Caused blisters   . Sulfa Antibiotics Rash  . Codeine Other (See Comments)    Headache  . Mitomycin Other (See Comments)    Blisters   Prior to Admission medications   Medication Sig Start Date End Date Taking? Authorizing Provider  aspirin EC 81 MG EC tablet Take 1 tablet (81 mg total) by mouth daily. 12/13/16  Yes Patteson, Arlan Organ, NP  busPIRone (BUSPAR) 5 MG tablet Take 5 mg by mouth 2 (two) times daily.   Yes [provider]  Coenzyme Q10 (CO Q 10 PO) Take 1 capsule by mouth every evening.   Yes [provider]  escitalopram (  LEXAPRO) 20 MG tablet Take 20 mg by mouth daily. 10/11/16  Yes [provider]  glimepiride (AMARYL) 4 MG tablet Take 4 mg by mouth 2 (two) times daily with a meal. 10/14/16  Yes [provider]  levothyroxine (SYNTHROID) 100 MCG tablet Take 100 mcg by mouth daily.  10/14/16  Yes [provider]  losartan (COZAAR) 25 MG tablet Take 25 mg by mouth daily.   Yes [provider]  metFORMIN (GLUCOPHAGE) 500 MG tablet Take 1,000 mg by mouth 2 (two) times daily. 10/11/16  Yes [provider]  Omega-3 Fatty Acids (FISH OIL PO) Take 1 capsule by mouth 2 (two) times daily.   Yes [provider]  simvastatin (ZOCOR) 20 MG tablet Take 20 mg by mouth at bedtime. 10/11/16  Yes [provider]  clobetasol ointment (TEMOVATE) [id number] % Apply 1 application topically 2 (two) times daily as needed (for  itching).  10/09/16   [provider]     Positive ROS: All other systems have been reviewed and were otherwise negative with the exception of those mentioned in the HPI and as above.  Physical Exam: General: Alert, no acute distress Cardiovascular: Regular rate and rhythm, no murmurs rubs or gallops.  No pedal edema Respiratory: Clear to auscultation bilaterally, no wheezes rales or rhonchi. No cyanosis, no use of accessory musculature GI: No organomegaly, abdomen is soft and non-tender nondistended with positive bowel sounds. Skin: Skin intact, no lesions within the operative field. Neurologic: Sensation intact distally Psychiatric: Patient is competent for consent with normal mood and affect Lymphatic: No axillary or cervical lymphadenopathy  MUSCULOSKELETAL: left ankle swelling, tender medial and lateral, motor and sensory intact, compartments soft  Assessment: Left Ankle Fracture  Plan: Plan for Procedure(s): OPEN REDUCTION INTERNAL FIXATION (ORIF) ANKLE FRACTURE  I discussed the risks and benefits of surgery. The risks include but are not limited to infection, bleeding requiring blood transfusion, nerve or blood vessel injury, joint stiffness or loss of motion, persistent pain, weakness or instability, malunion, nonunion and hardware failure and the need for further surgery. Medical risks include but are not limited to DVT and pulmonary embolism, myocardial infarction, stroke, pneumonia, respiratory failure and death. Patient understood these risks and wished to proceed.   Lovell Sheehan, MD   06/02/2019 11:08 AM

## 2019-06-02 NOTE — Op Note (Signed)
  06/02/2019  3:20 PM  PATIENT:  Shelia Evans  69 y.o. female  PRE-OPERATIVE DIAGNOSIS:  Left Ankle Trimalleolar Fracture  POST-OPERATIVE DIAGNOSIS:  Same  PROCEDURE:  LEFT, OPEN REDUCTION INTERNAL FIXATION (ORIF) ANKLE FRACTURE,  MEDIAL, LATERAL and SYNDESMOTIC FIXATION  SURGEON:  Kurtis Bushman, MD  ASST:  Carlynn Spry, PA-C  ANESTHESIA:   General  EBL:  25 mL  TOURNIQUET TIME:  55 at 250 mmHg  OPERATIVE FINDINGS:  Unstable trimalleolar ankle fracture, closed, displaced  OPERATIVE PROCEDURE:   The patient was brought to the operating room and placed in the supine position. All bony prominences were padded. General anesthesia was administered. The lower extremity was prepped and draped in the usual sterile fashion. The leg was elevated and exsanguinated and the tourniquet was inflated. Time out was performed.   Incision was made over the distal fibula and the fracture was exposed and reduced anatomically with a clamp. I then applied a synthes lateral locking plate and secured it proximally and distally with cortical screws and locking screws.  I used the Fluoroscan to confirm satisfactory reduction and fixation. This wound was irrigated and closed with vicryl sutures and staples.  I then turned my attention to the medial malleolus. Incision was made over the medial malleolus and the fracture exposed and held provisionally with a clamp. 2 guidepins were placed for the 4.0 mm cannulated screws and then confirmation of reduction was made with fluoroscopy. I then placed 2  48mm screws which had satisfactory fixation. Fluoroscopy showed good reduction and hardware placement.   The syndesmosis was stressed using live fluoroscopy and found to be unstable. A 40 mm 3.5 mm cortical screw was placed through the plate across the syndesmosis with excellent fixation and reduction.  The medial wound was irrigated, and closed with vicryl and staples. Sponge and needle counts were correct. Sterile  gauze was applied followed by a posterior splint. She was awakened and returned to the PACU in stable and satisfactory condition. There were no apparent complications.  Kurtis Bushman, MD

## 2019-06-02 NOTE — Anesthesia Preprocedure Evaluation (Signed)
Anesthesia Evaluation  Patient identified by MRN, date of birth, ID band Patient awake    Reviewed: Allergy & Precautions, NPO status , Patient's Chart, lab work & pertinent test results  History of Anesthesia Complications Negative for: history of anesthetic complications  Airway Mallampati: III  TM Distance: >3 FB Neck ROM: Full    Dental  (+) Poor Dentition   Pulmonary sleep apnea , neg COPD,    breath sounds clear to auscultation- rhonchi (-) wheezing      Cardiovascular (-) hypertension(-) CAD, (-) Past MI, (-) Cardiac Stents and (-) CABG  Rhythm:Regular Rate:Normal - Systolic murmurs and - Diastolic murmurs    Neuro/Psych neg Seizures negative neurological ROS  negative psych ROS   GI/Hepatic negative GI ROS, Neg liver ROS,   Endo/Other  diabetes, Oral Hypoglycemic AgentsHypothyroidism   Renal/GU negative Renal ROS     Musculoskeletal negative musculoskeletal ROS (+)   Abdominal (+) + obese,   Peds  Hematology negative hematology ROS (+)   Anesthesia Other Findings Past Medical History: 2006: Bladder cancer ([place]) 2008: Breast cancer ([place])     Comment:  RT LUMPECTOMY 2008: Personal history of radiation therapy     Comment:  right breast 2006: S/P chemotherapy, time since greater than 12 weeks     Comment:  BLADDER CA 2008: S/P radiation > 12 weeks     Comment:  BREAST CA   Reproductive/Obstetrics                             Anesthesia Physical Anesthesia Plan  ASA: II  Anesthesia Plan: General   Post-op Pain Management:  Regional for Post-op pain   Induction: Intravenous  PONV Risk Score and Plan: 2 and Ondansetron, Dexamethasone and Midazolam  Airway Management Planned: Oral ETT  Additional Equipment:   Intra-op Plan:   Post-operative Plan: Extubation in OR  Informed Consent: I have reviewed the patients History and Physical, chart, labs and discussed the  procedure including the risks, benefits and alternatives for the proposed anesthesia with the patient or authorized representative who has indicated his/her understanding and acceptance.     Dental advisory given  Plan Discussed with: CRNA and Anesthesiologist  Anesthesia Plan Comments:         Anesthesia Quick Evaluation

## 2019-06-03 LAB — GLUCOSE, CAPILLARY
Glucose-Capillary: 169 mg/dL — ABNORMAL HIGH (ref 70–99)
Glucose-Capillary: 162 mg/dL — ABNORMAL HIGH (ref 70–99)
Glucose-Capillary: 172 mg/dL — ABNORMAL HIGH (ref 70–99)
Glucose-Capillary: 205 mg/dL — ABNORMAL HIGH (ref 70–99)

## 2019-06-03 MED ORDER — DOCUSATE SODIUM 100 MG PO CAPS: 100.0000 mg | ORAL_CAPSULE | Freq: Two times a day (BID) | ORAL | 0 refills | Status: AC

## 2019-06-03 MED ORDER — OXYCODONE HCL 5 MG PO TABS: 5.0000 mg | ORAL_TABLET | ORAL | 0 refills | Status: AC | PRN

## 2019-06-03 MED ORDER — ONDANSETRON HCL 4 MG PO TABS: 4.0000 mg | ORAL_TABLET | Freq: Four times a day (QID) | ORAL | 0 refills | Status: AC | PRN

## 2019-06-03 MED ORDER — OXYCODONE HCL 10 MG PO TABS: 5.0000 mg | ORAL_TABLET | ORAL | 0 refills | Status: DC | PRN

## 2019-06-03 NOTE — Plan of Care (Signed)
  Problem: Education: Goal: Knowledge of General Education information will improve Description Including pain rating scale, medication(s)/side effects and non-pharmacologic comfort measures Outcome: Progressing   

## 2019-06-03 NOTE — Evaluation (Signed)
Occupational Therapy Evaluation Patient Details Name: Shelia Evans MRN: [id number] DOB: 23-Jul-1950 Today's Date: 06/03/2019    History of Present Illness 69 y/o female who fell while carrying boxes down steps and suffered L ankle fx 2/2.  s/p ORIF 2/3, now NWBing on L.  Reports acute on chronic anxiety related to current situation   Clinical Impression   Pt was seen for OT evaluation this date. Prior to hospital admission, pt was Indep and living alone, performing all ADLs/IADLs I'ly including driving. Currently pt demonstrates impairments as described below (See OT problem list) which functionally limit her ability to perform ADL/self-care tasks. Pt currently requires setup A for UB ADLs, MAX A for LB ADLs and declines to mobilize d/t emotional state throughout session-endorses feeling anxious.  OT employs therapeutic use of self throughout session to comfort pt who is appreciative and appears more calm at end of session. Pt would benefit from skilled OT to address noted impairments and functional limitations (see below for any additional details) in order to maximize safety and independence while minimizing falls risk and caregiver burden. Upon hospital discharge, recommend STR to maximize pt safety and return to PLOF.     Follow Up Recommendations  SNF    Equipment Recommendations  3 in 1 bedside commode    Recommendations for Other Services       Precautions / Restrictions Precautions Precautions: Fall Restrictions Weight Bearing Restrictions: Yes LLE Weight Bearing: Non weight bearing      Mobility Bed Mobility Overal bed mobility: Needs Assistance Bed Mobility: Sit to Supine       Sit to supine: Min guard   General bed mobility comments: unable to assess at this time, pt declines citing feeling anxious  Transfers  Sit to Stand: Min assist with RW per PT note         General transfer comment: unable to assess at this time, pt declines citing feeling  anxious    Balance  Standing balance comment: unable to assess at this time, pt declines citing feeling anxious                           ADL either performed or assessed with clinical judgement   ADL                                         General ADL Comments: pt only assessed performing UB grooming and taking drink from cup with straw on eval 2/2 tearfullness, pt able to perform at bed level with HOB elevated with setup. Anticiapte pt could perform all UB ADLs  at MOD I to I level if seated in chair or EOB.     Vision Patient Visual Report: No change from baseline       Perception     Praxis      Pertinent Vitals/Pain Pain Assessment: No/denies pain(did not mobilize OOB during OT eval as pt is tearful over situation, unable to assess for change in pain level with mobility.)     Hand Dominance Right   Extremity/Trunk Assessment Upper Extremity Assessment Upper Extremity Assessment: Overall WFL for tasks assessed   Lower Extremity Assessment Lower Extremity Assessment: Defer to PT evaluation;Overall WFL for tasks assessed;LLE deficits/detail LLE: Unable to fully assess due to pain       Communication Communication Communication: No difficulties   Cognition Arousal/Alertness: Awake/alert  Behavior During Therapy: Anxious Overall Cognitive Status: Within Functional Limits for tasks assessed                                 General Comments: extended time required to gather information d/t tearfullness/percieved anxiousness.   General Comments       Exercises Other Exercises Other Exercises: OT facilitates education re: role of OT in acute setting and in rehab, pt verbalized understanding.   Shoulder Instructions      Home Living Family/patient expects to be discharged to:: Skilled nursing facility Living Arrangements: Alone                                      Prior Functioning/Environment Level of  Independence: Independent        Comments: Pt normally able to be active and independent        OT Problem List: Decreased strength;Decreased range of motion;Decreased activity tolerance;Impaired balance (sitting and/or standing);Decreased knowledge of use of DME or AE;Pain      OT Treatment/Interventions: Self-care/ADL training;Therapeutic exercise;DME and/or AE instruction;Therapeutic activities;Patient/family education;Balance training    OT Goals(Current goals can be found in the care plan section) Acute Rehab OT Goals Patient Stated Goal: not need help from others to do day to day things OT Goal Formulation: With patient Time For Goal Achievement: 06/17/19 Potential to Achieve Goals: Good  OT Frequency: Min 2X/week   Barriers to D/C:            Co-evaluation              AM-PAC OT "6 Clicks" Daily Activity     Outcome Measure Help from another person eating meals?: None Help from another person taking care of personal grooming?: A Little Help from another person toileting, which includes using toliet, bedpan, or urinal?: A Lot Help from another person bathing (including washing, rinsing, drying)?: A Lot Help from another person to put on and taking off regular upper body clothing?: A Little Help from another person to put on and taking off regular lower body clothing?: A Lot 6 Click Score: 16   End of Session Nurse Communication: Other (comment)(emotional state, RN aware)  Activity Tolerance: Other (comment)(treatment limited d/t emotional state. Pt somewhat agreeable to assessment data being collected, but not to mobilizing at this time.) Patient left: in bed;with call bell/phone within reach;with bed alarm set  OT Visit Diagnosis: Unsteadiness on feet (R26.81);History of falling (Z91.81)                Time: [id number] OT Time Calculation (min): 23 min Charges:  OT General Charges $OT Visit: 1 Visit OT Evaluation $OT Eval Moderate Complexity: 1 Mod OT  Treatments $Self Care/Home Management : 8-22 mins  Gerrianne Scale, MS, OTR/L ascom [phone] 06/03/19, 4:57 PM

## 2019-06-03 NOTE — Discharge Summary (Signed)
Physician Discharge Summary  Patient ID: Shelia Evans MRN: [id number] DOB/AGE: [birth year] 69 y.o.  Admit date: 06/01/2019 Discharge date: 06/03/2019  Admission Diagnoses:  Left Ankle Fracture Closed left ankle fracture  Discharge Diagnoses:  Left Ankle Fracture Principal Problem:   Closed left ankle fracture Active Problems:   History of TIA (transient ischemic attack)   HLD (hyperlipidemia)   Type 2 diabetes mellitus with hyperlipidemia (HCC)   History of right breast cancer   History of bladder cancer   Hypothyroidism   OSA on CPAP   Past Medical History:  Diagnosis Date  . Bladder cancer ([place]) 2006  . Breast cancer ([place]) 2008   RT LUMPECTOMY  . Personal history of radiation therapy 2008   right breast  . S/P chemotherapy, time since greater than 12 weeks 2006   BLADDER CA  . S/P radiation > 12 weeks 2008   BREAST CA    Surgeries: Procedure(s): OPEN REDUCTION INTERNAL FIXATION (ORIF) ANKLE FRACTURE on 06/02/2019   Consultants (if any): Treatment Team:  Enzo Bi, MD  Discharged Condition: Improved  Hospital Course: Shelia Evans is an 69 y.o. female who was admitted 06/01/2019 with a diagnosis of  Left Ankle Fracture Closed left ankle fracture and went to the operating room on 06/02/2019 and underwent the above named procedures.    She was given perioperative antibiotics:  Anti-infectives (From admission, onward)   Start     Dose/Rate Route Frequency Ordered Stop   06/02/19 1320  50,000 units bacitracin in 0.9% normal saline 250 mL irrigation  Status:  Discontinued       As needed 06/02/19 1357 06/02/19 1520   06/02/19 0832  ceFAZolin (ANCEF) IVPB 2g/100 mL premix     2 g 200 mL/hr over 30 Minutes Intravenous 30 min pre-op 06/02/19 0832 06/02/19 1339    .  She was given sequential compression devices, early ambulation, and Aspirin for DVT prophylaxis.  She benefited maximally from the hospital stay and there were no complications.    Recent vital  signs:  Vitals:   06/02/19 2307 06/03/19 0408  BP: (!) 103/55 (!) 103/57  Pulse: 79 67  Resp: 16 16  Temp: 98.1 F (36.7 C) 97.9 F (36.6 C)  SpO2: 99% 99%    Recent laboratory studies:  Lab Results  Component Value Date   HGB 12.4 06/01/2019   HGB 12.6 12/10/2016   HGB 12.5 12/10/2016   Lab Results  Component Value Date   WBC 12.9 (H) 06/01/2019   PLT 252 06/01/2019   Lab Results  Component Value Date   INR 0.86 12/10/2016   Lab Results  Component Value Date   NA 141 06/01/2019   K 4.5 06/01/2019   CL 105 06/01/2019   CO2 25 06/01/2019   BUN 19 06/01/2019   CREATININE 0.82 06/01/2019   GLUCOSE 153 (H) 06/01/2019    Discharge Medications:   Allergies as of 06/03/2019      Reactions   Lisinopril Rash   Meloxicam Other (See Comments), Hives   Blisters Caused blisters    Sulfa Antibiotics Rash   Codeine Other (See Comments)   Headache   Mitomycin Other (See Comments)   Blisters      Medication List    TAKE these medications   aspirin 81 MG EC tablet Take 1 tablet (81 mg total) by mouth daily.   busPIRone 5 MG tablet Commonly known as: BUSPAR Take 5 mg by mouth 2 (two) times daily.   clobetasol ointment 0.05 %  Commonly known as: TEMOVATE Apply 1 application topically 2 (two) times daily as needed (for itching).   CO Q 10 PO Take 1 capsule by mouth every evening.   docusate sodium 100 MG capsule Commonly known as: COLACE Take 1 capsule (100 mg total) by mouth 2 (two) times daily.   escitalopram 20 MG tablet Commonly known as: LEXAPRO Take 20 mg by mouth daily.   FISH OIL PO Take 1 capsule by mouth 2 (two) times daily.   glimepiride 4 MG tablet Commonly known as: AMARYL Take 4 mg by mouth 2 (two) times daily with a meal.   levothyroxine 100 MCG tablet Commonly known as: SYNTHROID Take 100 mcg by mouth daily.   losartan 25 MG tablet Commonly known as: COZAAR Take 25 mg by mouth daily.   metFORMIN 500 MG tablet Commonly known as:  GLUCOPHAGE Take 1,000 mg by mouth 2 (two) times daily.   ondansetron 4 MG tablet Commonly known as: ZOFRAN Take 1 tablet (4 mg total) by mouth every 6 (six) hours as needed for nausea.   Oxycodone HCl 10 MG Tabs Take 0.5 tablets (5 mg total) by mouth every 4 (four) hours as needed for moderate pain.   simvastatin 20 MG tablet Commonly known as: ZOCOR Take 20 mg by mouth at bedtime.       Diagnostic Studies: DG Ankle 2 Views Left  Result Date: 06/01/2019 CLINICAL DATA:  Post reduction EXAM: LEFT ANKLE - 2 VIEW COMPARISON:  Radiograph same day FINDINGS: There is been interval reduction of the ankle dislocation. There is approved alignment of tibiotalar joint with approximately 1 cm of medial clear space widening. Comminuted fracture of the posterior malleolus and medial malleolus is seen. Mildly displaced obliquely oriented distal fibular fracture. IMPRESSION: Interval reduction of tibiotalar dislocation with improved alignment. 1 cm medial clear space widening. Electronically Signed   By: Prudencio Pair M.D.   On: 06/01/2019 16:07   DG Ankle Complete Left  Result Date: 06/01/2019 CLINICAL DATA:  Left ankle pain and deformity after fall today. EXAM: LEFT ANKLE COMPLETE - 3+ VIEW COMPARISON:  None. FINDINGS: Severely displaced and comminuted distal left fibular fracture is noted. Severely displaced fractures are seen involving the medial and lateral portions of the distal tibia. There is severe lateral dislocation of the talus relative to the tibia. IMPRESSION: Severely displaced and comminuted distal left fibular fracture. Severely displaced fractures are seen involving the medial and lateral portions of the distal tibia with severe lateral dislocation of the talus relative to distal tibia. Electronically Signed   By: Marijo Conception M.D.   On: 06/01/2019 14:38   CT Ankle Left Wo Contrast  Result Date: 06/01/2019 CLINICAL DATA:  Ankle trauma EXAM: CT OF THE LEFT ANKLE WITHOUT CONTRAST TECHNIQUE:  Multidetector CT imaging of the left ankle was performed according to the standard protocol. Multiplanar CT image reconstructions were also generated. COMPARISON:  None. FINDINGS: Bones/Joint/Cartilage There is been interval reduction of the ankle dislocation. There is improved alignment of the distal tibia on the talus. However there is still approximately 1.1 cm of medial clear space widening. There is a comminuted mildly displaced medial malleolar fracture with still articulates with the talus. There is comminuted displaced posterior malleolar fracture with small fracture fragment seen in the posterior joint space. There is slight impaction of the posterior distal tibia with approximately 4 mm of overlying articular surface depression mildly displaced obliquely oriented fracture of the distal fibula seen with small fracture fragments in the syndesmosis. No  other fractures are identified. Calcaneal enthesophytes are noted. A small ankle joint effusion is seen. Ligaments Suboptimally assessed by CT. Muscles and Tendons The muscles surrounding the ankle appear to be grossly intact. The flexor and extensor tendons appear to be grossly intact. There is a os navicularae. the Achilles tendon is intact. Soft tissues Diffuse subcutaneous edema and soft tissue swelling seen surrounding the ankle. There is also heel pad inflammation. IMPRESSION: Interval improved alignment of the ankle dislocation, however approximately 1 cm of medial clear space widening. Comminuted medial and posterior malleolar fractures with overlying 4 mm articular surface depression at the distal posterior tibia. Mildly displaced distal fibular fracture. Electronically Signed   By: Prudencio Pair M.D.   On: 06/01/2019 15:57   DG Chest Portable 1 View  Result Date: 06/01/2019 CLINICAL DATA:  Post reduction left ankle EXAM: PORTABLE CHEST 1 VIEW COMPARISON:  None. FINDINGS: The heart size and mediastinal contours are within normal limits. Both lungs  are clear. Rounded calcifications seen overlying the right lateral chest wall. No acute osseous abnormality. IMPRESSION: No active disease. Electronically Signed   By: Prudencio Pair M.D.   On: 06/01/2019 16:06   [place] OR NERVE BLOCK-IMAGE ONLY [facility])  Result Date: 06/02/2019 There is no interpretation for this exam.  This order is for images obtained during a surgical procedure.  Please See "Surgeries" Tab for more information regarding the procedure.    Disposition:  Discharge to SNF Follow up in 12 days at DR. Harlow Mares office.     Signed: Carlynn Spry ,PA-C 06/03/2019, 7:18 AM
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
## 2019-06-03 NOTE — NC FL2 (Addendum)
[place] LEVEL OF CARE SCREENING TOOL     IDENTIFICATION  Patient Name: Shelia Evans Birthdate: [birth year] Sex: female Admission Date (Current Location): 06/01/2019  [place] and [place] Number:  Engineering geologist and Address:  [facility], [address], [place], [place] [address]      Provider Number: [id number]  Attending Physician Name and Address:  Lovell Sheehan, MD  Relative Name and Phone Number:  Sherron Flemings Niece [phone]    Current Level of Care: Hospital Recommended Level of Care: [place] Prior Approval Number:    Date Approved/Denied:   PASRR Number: [id number]  Discharge Plan: SNF    Current Diagnoses: Patient Active Problem List   Diagnosis Date Noted  . Closed left ankle fracture 06/01/2019  . History of TIA (transient ischemic attack) 06/01/2019  . HLD (hyperlipidemia) 06/01/2019  . Type 2 diabetes mellitus with hyperlipidemia ([place]) 06/01/2019  . History of right breast cancer 06/01/2019  . History of bladder cancer 06/01/2019  . Hypothyroidism 06/01/2019  . OSA on CPAP 06/01/2019    Orientation RESPIRATION BLADDER Height & Weight     Self, Time, Situation, Place  Normal External catheter, Continent Weight: 84 kg Height:  5\' 6"  (167.6 cm)  BEHAVIORAL SYMPTOMS/MOOD NEUROLOGICAL BOWEL NUTRITION STATUS     Continent Diet  AMBULATORY STATUS COMMUNICATION OF NEEDS Skin   Extensive Assist Verbally Surgical wounds                       Personal Care Assistance Level of Assistance  Dressing     Dressing Assistance: Limited assistance     Functional Limitations Info             SPECIAL CARE FACTORS FREQUENCY  PT (By licensed PT)     PT Frequency: 5 times per week              Contractures Contractures Info: Not present    Additional Factors Info  Allergies   Allergies Info: Lisinopril, Meloxicam, Sulfa Antibiotics, Codeine, Mitomycin           Current  Medications (06/03/2019):  This is the current hospital active medication list Current Facility-Administered Medications  Medication Dose Route Frequency Provider Last Rate Last Admin  . acetaminophen (TYLENOL) tablet 1,000 mg  1,000 mg Oral Q8H Lovell Sheehan, MD   1,000 mg at 06/03/19 1159  . bisacodyl (DULCOLAX) suppository 10 mg  10 mg Rectal Daily PRN Lovell Sheehan, MD      . busPIRone (BUSPAR) tablet 5 mg  5 mg Oral BID Lovell Sheehan, MD   5 mg at 06/03/19 0827  . diphenhydrAMINE (BENADRYL) injection 12.5 mg  12.5 mg Intravenous Q6H PRN Lovell Sheehan, MD       Or  . diphenhydrAMINE (BENADRYL) 12.5 MG/5ML elixir 12.5 mg  12.5 mg Oral Q6H PRN Lovell Sheehan, MD      . docusate sodium (COLACE) capsule 100 mg  100 mg Oral BID Lovell Sheehan, MD   100 mg at 06/02/19 2133  . docusate sodium (COLACE) capsule 100 mg  100 mg Oral BID Lovell Sheehan, MD   100 mg at 06/03/19 [id number]  . escitalopram (LEXAPRO) tablet 20 mg  20 mg Oral Daily Lovell Sheehan, MD   20 mg at 06/03/19 [id number]  . HYDROmorphone (DILAUDID) injection 1 mg  1 mg Intravenous Q2H PRN Lovell Sheehan, MD   1 mg at 06/02/19 0834  .  insulin aspart (novoLOG) injection 0-5 Units  0-5 Units Subcutaneous QHS Lovell Sheehan, MD   2 Units at 06/02/19 2141  . insulin aspart (novoLOG) injection 0-9 Units  0-9 Units Subcutaneous TID WC Lovell Sheehan, MD   2 Units at 06/03/19 1200  . lactated ringers infusion   Intravenous Continuous Lovell Sheehan, MD 0 mL/hr at 06/02/19 0109 Restarted at 06/02/19 1312  . lactated ringers infusion   Intravenous Continuous Lovell Sheehan, MD 75 mL/hr at 06/02/19 1705 New Bag at 06/02/19 1705  . levothyroxine (SYNTHROID) tablet 100 mcg  100 mcg Oral Daily Lovell Sheehan, MD   100 mcg at 06/03/19 0534  . magnesium hydroxide (MILK OF MAGNESIA) suspension 30 mL  30 mL Oral Daily PRN Lovell Sheehan, MD      . metoCLOPramide (REGLAN) tablet 5-10 mg  5-10 mg Oral Q8H PRN Lovell Sheehan, MD       Or  .  metoCLOPramide (REGLAN) injection 5-10 mg  5-10 mg Intravenous Q8H PRN Lovell Sheehan, MD      . naloxone [facility]) injection 0.4 mg  0.4 mg Intravenous PRN Lovell Sheehan, MD       And  . sodium chloride flush (NS) 0.9 % injection 9 mL  9 mL Intravenous PRN Lovell Sheehan, MD      . ondansetron [facility]) injection 4 mg  4 mg Intravenous Q6H PRN Lovell Sheehan, MD   4 mg at 06/02/19 1335  . ondansetron (ZOFRAN) tablet 4 mg  4 mg Oral Q6H PRN Lovell Sheehan, MD       Or  . ondansetron [facility]) injection 4 mg  4 mg Intravenous Q6H PRN Lovell Sheehan, MD      . oxyCODONE (Oxy IR/ROXICODONE) immediate release tablet 10 mg  10 mg Oral Q4H PRN Lovell Sheehan, MD   10 mg at 06/02/19 2133  . senna (SENOKOT) tablet 8.6 mg  1 tablet Oral BID Lovell Sheehan, MD   8.6 mg at 06/03/19 [id number]  . simvastatin (ZOCOR) tablet 20 mg  20 mg Oral QHS Lovell Sheehan, MD   20 mg at 06/02/19 2131     Discharge Medications: Please see discharge summary for a list of discharge medications.  Relevant Imaging Results:  Relevant Lab Results:   Additional Information [id number]  Su Hilt, RN

## 2019-06-03 NOTE — Progress Notes (Signed)
Physical Therapy Treatment Patient Details Name: Shelia Evans MRN: [id number] DOB: 12-Apr-1951 Today's Date: 06/03/2019    History of Present Illness 69 y/o female who fell while carrying boxes down steps and suffered L ankle fx 2/2.  s/p ORIF 2/3, now NWBing on L.  Reports acute on chronic anxiety related to current situation    PT Comments    Pt continues to be self-limiting secondary to anxiety and simply could not muster the mindset do do more than some minimal participation.  She did a nice job with hopping ambulation around the foot of the bed with FWW, but did fatigue with the effort and clearly was not in the state to try to do more.  She swears that she just needs a day and that she will feel better and be able to do more tomorrow but requests to hold further PT today as she needs to get her anxiety under control  Did discuss with nursing after session.  Follow Up Recommendations  SNF     Equipment Recommendations  Rolling walker with 5" wheels    Recommendations for Other Services       Precautions / Restrictions Precautions Precautions: Fall Restrictions LLE Weight Bearing: Non weight bearing    Mobility  Bed Mobility Overal bed mobility: Needs Assistance Bed Mobility: Sit to Supine     Supine to sit: Min guard Sit to supine: Min guard   General bed mobility comments: Pt able to get LEs back up into bed getting to supine from sit  Transfers Overall transfer level: Needs assistance Equipment used: Rolling walker (2 wheeled) Transfers: Sit to/from Stand Sit to Stand: Min assist         General transfer comment: Pt able to attain standing w/o assist, did not control descent back into bed despite cuing to slow and control.  Ambulation/Gait Ambulation/Gait assistance: Supervision Gait Distance (Feet): 15 Feet Assistive device: Rolling walker (2 wheeled)       General Gait Details: Pt was able to take hopping steps with relatively good confidence and  safety.  She did not have any LOBs or ovdert unsteadiness though tired and likely could not have done much more after getting around foot of bed and back to sitting.   Stairs             Wheelchair Mobility    Modified Rankin (Stroke Patients Only)       Balance Overall balance assessment: Modified Independent;Needs assistance Sitting-balance support: Bilateral upper extremity supported Sitting balance-Leahy Scale: Normal     Standing balance support: Bilateral upper extremity supported Standing balance-Leahy Scale: Fair Standing balance comment: reliant on walker, but able to maintain L NWBing safetly                            Cognition Arousal/Alertness: Awake/alert Behavior During Therapy: Anxious Overall Cognitive Status: (anxiety getting the better of her at this time)                                 General Comments: Continues to report greater than usual level of anxiety, limited participation      Exercises      General Comments        Pertinent Vitals/Pain Pain Assessment: No/denies pain(general L ankle/leg pain, numbness)    Home Living Family/patient expects to be discharged to:: Skilled nursing facility Living Arrangements: Alone  Prior Function Level of Independence: Independent      Comments: Pt normally able to be active and independent   PT Goals (current goals can now be found in the care plan section) Acute Rehab PT Goals Patient Stated Goal: go home PT Goal Formulation: With patient Time For Goal Achievement: 06/17/19 Potential to Achieve Goals: Fair Progress towards PT goals: Progressing toward goals    Frequency    Min 2X/week      PT Plan Current plan remains appropriate    Co-evaluation              AM-PAC PT "6 Clicks" Mobility   Outcome Measure  Help needed turning from your back to your side while in a flat bed without using bedrails?: None Help needed  moving from lying on your back to sitting on the side of a flat bed without using bedrails?: None Help needed moving to and from a bed to a chair (including a wheelchair)?: A Little Help needed standing up from a chair using your arms (e.g., wheelchair or bedside chair)?: A Little Help needed to walk in hospital room?: A Lot Help needed climbing 3-5 steps with a railing? : Total 6 Click Score: 17    End of Session Equipment Utilized During Treatment: Gait belt Activity Tolerance: Patient limited by fatigue;Patient tolerated treatment well Patient left: with bed alarm set;with call bell/phone within reach Nurse Communication: Mobility status PT Visit Diagnosis: Muscle weakness (generalized) (M62.81);Difficulty in walking, not elsewhere classified (R26.2)     Time: [id number] PT Time Calculation (min) (ACUTE ONLY): 9 min  Charges:  $Gait Training: 8-22 mins $Therapeutic Activity: 8-22 mins                     Kreg Shropshire, DPT 06/03/2019, 2:47 PM

## 2019-06-03 NOTE — TOC Progression Note (Signed)
Transition of Care [facility] - Progression Note    Patient Details  Name: AMOR CAPRETTA MRN: [id number] Date of Birth: October 13, 1950  Transition of Care [facility]) CM/SW [place], RN Phone Number: 06/03/2019, 4:06 PM  Clinical Narrative:     PT recommends SNF, PASSR completed FL2 done and bed search sent, will review the bed offers once obtained       Expected Discharge Plan and Services                                                 Social Determinants of Health (SDOH) Interventions    Readmission Risk Interventions No flowsheet data found.

## 2019-06-03 NOTE — Discharge Instructions (Signed)
Continue non weight bear as tolerated on the left lower extremity.    Elevate the left lower extremity whenever possible and continue the polar care while elevating the extremity. Patient may shower. No bath or submerging the wound.    Take Aspirin 81 as directed for blood clot prevention.  Continue to use a walker for assistance with ambulation until cleared by physical therapy.  Call [phone] with any questions, such as fever > 101.5 degrees, drainage from the wound or shortness of breath.

## 2019-06-03 NOTE — Evaluation (Signed)
Physical Therapy Evaluation Patient Details Name: Shelia Evans MRN: [id number] DOB: April 03, 1951 Today's Date: 06/03/2019   History of Present Illness  69 y/o female who fell while carrying boxes down steps and suffered L ankle fx. Reports acute on chronic anxiety related to current situation  Clinical Impression  Pt initially quite hesitant to do a whole lot, citing anxiety and generally not feeling well about her situation.  However she ultimately showed good LE strength and ability to mobilize in bed w/o physical assist. She was able to tolerate some brief standing and did reasonably well but did not feel like she could do more this AM, again mostly citing anxiety.  Pt will need STR once medically ready for d/c before she is safe to go home alone.      Follow Up Recommendations SNF    Equipment Recommendations  Rolling walker with 5" wheels(TBD at next venue)    Recommendations for Other Services       Precautions / Restrictions Precautions Precautions: Fall Restrictions LLE Weight Bearing: Non weight bearing      Mobility  Bed Mobility Overal bed mobility: Needs Assistance Bed Mobility: Supine to Sit     Supine to sit: Min guard     General bed mobility comments: Pt was able to rise to sitting EOB w/o phyiscal assist but needed constant cuing/encouragement  Transfers Overall transfer level: Needs assistance Equipment used: Rolling walker (2 wheeled) Transfers: Sit to/from Stand Sit to Stand: Min assist         General transfer comment: Pt able to get to standing w/o direct physical assist, she was anxious but ultimately able to transition relatively well to standing.  Good ability to maintain NWBing on L.  Ambulation/Gait Ambulation/Gait assistance: Supervision Gait Distance (Feet): 3 Feet Assistive device: Rolling walker (2 wheeled)       General Gait Details: Pt used more of a heel-toe strategy in getting bed to recliner - did manage a few small actual  hopping steps with cuing (chair behind).  Good NWBing maintainance, minimal fatigue in UEs.  Stairs            Wheelchair Mobility    Modified Rankin (Stroke Patients Only)       Balance Overall balance assessment: Modified Independent;Needs assistance Sitting-balance support: Bilateral upper extremity supported Sitting balance-Leahy Scale: Normal     Standing balance support: Bilateral upper extremity supported Standing balance-Leahy Scale: Fair Standing balance comment: reliant on walker, but able to maintain L NWBing safetly                             Pertinent Vitals/Pain      Home Living Family/patient expects to be discharged to:: Skilled nursing facility Living Arrangements: Alone                    Prior Function Level of Independence: Independent         Comments: Pt normally able to be active and independent     Hand Dominance        Extremity/Trunk Assessment   Upper Extremity Assessment Upper Extremity Assessment: Overall WFL for tasks assessed    Lower Extremity Assessment Lower Extremity Assessment: Overall WFL for tasks assessed(L foot/ankle numb, no toe AROM, good strength otherwise)       Communication   Communication: No difficulties  Cognition Arousal/Alertness: Awake/alert Behavior During Therapy: Anxious Overall Cognitive Status: Within Functional Limits for tasks assessed  General Comments: Pt reporting greater than usual level of anxiety, but able to participate appropriately      General Comments      Exercises     Assessment/Plan    PT Assessment Patient needs continued PT services  PT Problem List Decreased strength;Decreased range of motion;Decreased balance;Decreased activity tolerance;Decreased mobility;Decreased coordination;Decreased cognition;Decreased knowledge of use of DME;Decreased safety awareness;Pain       PT Treatment Interventions  DME instruction;Gait training;Functional mobility training;Therapeutic exercise;Therapeutic activities;Balance training;Neuromuscular re-education;Patient/family education    PT Goals (Current goals can be found in the Care Plan section)  Acute Rehab PT Goals Patient Stated Goal: go home PT Goal Formulation: With patient Time For Goal Achievement: 06/17/19 Potential to Achieve Goals: Fair    Frequency Min 2X/week   Barriers to discharge        Co-evaluation               AM-PAC PT "6 Clicks" Mobility  Outcome Measure Help needed turning from your back to your side while in a flat bed without using bedrails?: None Help needed moving from lying on your back to sitting on the side of a flat bed without using bedrails?: None Help needed moving to and from a bed to a chair (including a wheelchair)?: A Little Help needed standing up from a chair using your arms (e.g., wheelchair or bedside chair)?: A Little Help needed to walk in hospital room?: A Lot Help needed climbing 3-5 steps with a railing? : Total 6 Click Score: 17    End of Session Equipment Utilized During Treatment: Gait belt Activity Tolerance: Patient limited by fatigue;Patient tolerated treatment well Patient left: with bed alarm set;with call bell/phone within reach Nurse Communication: Mobility status PT Visit Diagnosis: Muscle weakness (generalized) (M62.81);Difficulty in walking, not elsewhere classified (R26.2)    Time: [id number] PT Time Calculation (min) (ACUTE ONLY): 27 min   Charges:   PT Evaluation $PT Eval Low Complexity: 1 Low PT Treatments $Therapeutic Activity: 8-22 mins        Kreg Shropshire, DPT 06/03/2019, 1:10 PM

## 2019-06-04 LAB — GLUCOSE, CAPILLARY
Glucose-Capillary: 130 mg/dL — ABNORMAL HIGH (ref 70–99)
Glucose-Capillary: 166 mg/dL — ABNORMAL HIGH (ref 70–99)
Glucose-Capillary: 134 mg/dL — ABNORMAL HIGH (ref 70–99)
Glucose-Capillary: 137 mg/dL — ABNORMAL HIGH (ref 70–99)

## 2019-06-04 MED ORDER — METHOCARBAMOL 500 MG PO TABS
500.0000 mg | ORAL_TABLET | Freq: Four times a day (QID) | ORAL | Status: DC
  Administered 2019-06-04 – 2019-06-06 (×6): 500 mg via ORAL
  Filled 2019-06-04 (×9): qty 1

## 2019-06-04 MED ORDER — METHOCARBAMOL 500 MG PO TABS: 500.0000 mg | ORAL_TABLET | Freq: Four times a day (QID) | ORAL | 1 refills | Status: AC

## 2019-06-04 NOTE — TOC Initial Note (Signed)
Transition of Care [facility] - Initial/Assessment Note    Patient Details  Name: Shelia Evans MRN: [id number] Date of Birth: 01-14-1951  [facility] CM/SW Contact:    Su Hilt, RN Phone Number: 06/04/2019, 1:40 PM  Clinical Narrative:                 Discussed Bed offer with the patient she agrees to [place], [place] notified, they will start insurance auth with [employer].  Linton Rump the PA will discontinue the DC order until insurance auth is obtained        Patient Goals and CMS Choice        Expected Discharge Plan and Services           Expected Discharge Date: 06/04/19                                    Prior Living Arrangements/Services                       Activities of Daily Living Home Assistive Devices/Equipment: None ADL Screening (condition at time of admission) Patient's cognitive ability adequate to safely complete daily activities?: Yes Is the patient deaf or have difficulty hearing?: No Does the patient have difficulty seeing, even when wearing glasses/contacts?: No Does the patient have difficulty concentrating, remembering, or making decisions?: No Patient able to express need for assistance with ADLs?: Yes Does the patient have difficulty dressing or bathing?: No Independently performs ADLs?: Yes (appropriate for developmental age) Does the patient have difficulty walking or climbing stairs?: No Weakness of Legs: Left Weakness of Arms/Hands: None  Permission Sought/Granted                  Emotional Assessment              Admission diagnosis:  Closed left ankle fracture [S82.892A] Fall at home, initial encounter [W19.XXXA, Y92.009] Closed fracture dislocation of left ankle, initial encounter [id number] Patient Active Problem List   Diagnosis Date Noted  . Closed left ankle fracture 06/01/2019  . History of TIA (transient ischemic attack) 06/01/2019  . HLD (hyperlipidemia) 06/01/2019  . Type 2  diabetes mellitus with hyperlipidemia ([place]) 06/01/2019  . History of right breast cancer 06/01/2019  . History of bladder cancer 06/01/2019  . Hypothyroidism 06/01/2019  . OSA on CPAP 06/01/2019   PCP:  Dion Body, MD Pharmacy:   [facility] [place] [address] [place] [address] Phone: [phone] Fax: [phone]     Social Determinants of Health (SDOH) Interventions    Readmission Risk Interventions No flowsheet data found.

## 2019-06-04 NOTE — Discharge Summary (Signed)
Physician Discharge Summary  Patient ID: Shelia Evans MRN: [id number] DOB/AGE: 12-01-1950 69 y.o.  Admit date: 06/01/2019 Discharge date: 06/04/2019  Admission Diagnoses:  Left Ankle Fracture Closed left ankle fracture  Discharge Diagnoses:  Left Ankle Fracture Principal Problem:   Closed left ankle fracture Active Problems:   History of TIA (transient ischemic attack)   HLD (hyperlipidemia)   Type 2 diabetes mellitus with hyperlipidemia (HCC)   History of right breast cancer   History of bladder cancer   Hypothyroidism   OSA on CPAP   Past Medical History:  Diagnosis Date  . Bladder cancer ([place]) 2006  . Breast cancer ([place]) 2008   RT LUMPECTOMY  . Personal history of radiation therapy 2008   right breast  . S/P chemotherapy, time since greater than 12 weeks 2006   BLADDER CA  . S/P radiation > 12 weeks 2008   BREAST CA    Surgeries: Procedure(s): OPEN REDUCTION INTERNAL FIXATION (ORIF) ANKLE FRACTURE on 06/02/2019   Consultants (if any):   Discharged Condition: Improved  Hospital Course: Shelia Evans is an 69 y.o. female who was admitted 06/01/2019 with a diagnosis of  Left Ankle Fracture Closed left ankle fracture and went to the operating room on 06/02/2019 and underwent the above named procedures.    She was given perioperative antibiotics:  Anti-infectives (From admission, onward)   Start     Dose/Rate Route Frequency Ordered Stop   06/02/19 1320  50,000 units bacitracin in 0.9% normal saline 250 mL irrigation  Status:  Discontinued       As needed 06/02/19 1357 06/02/19 1520   06/02/19 0832  ceFAZolin (ANCEF) IVPB 2g/100 mL premix     2 g 200 mL/hr over 30 Minutes Intravenous 30 min pre-op 06/02/19 0832 06/02/19 1339    .  She was given sequential compression devices, early ambulation, and Aspirin for DVT prophylaxis.  She benefited maximally from the hospital stay and there were no complications.    Recent vital signs:  Vitals:   06/04/19 0022  06/04/19 0745  BP: (!) 112/58 109/65  Pulse: 66 61  Resp: 16 16  Temp: 98.1 F (36.7 C) 97.9 F (36.6 C)  SpO2: 96% 94%    Recent laboratory studies:  Lab Results  Component Value Date   HGB 12.4 06/01/2019   HGB 12.6 12/10/2016   HGB 12.5 12/10/2016   Lab Results  Component Value Date   WBC 12.9 (H) 06/01/2019   PLT 252 06/01/2019   Lab Results  Component Value Date   INR 0.86 12/10/2016   Lab Results  Component Value Date   NA 141 06/01/2019   K 4.5 06/01/2019   CL 105 06/01/2019   CO2 25 06/01/2019   BUN 19 06/01/2019   CREATININE 0.82 06/01/2019   GLUCOSE 153 (H) 06/01/2019    Discharge Medications:   Allergies as of 06/04/2019      Reactions   Lisinopril Rash   Meloxicam Other (See Comments), Hives   Blisters Caused blisters    Sulfa Antibiotics Rash   Codeine Other (See Comments)   Headache   Mitomycin Other (See Comments)   Blisters      Medication List    TAKE these medications   aspirin 81 MG EC tablet Take 1 tablet (81 mg total) by mouth daily.   busPIRone 5 MG tablet Commonly known as: BUSPAR Take 5 mg by mouth 2 (two) times daily.   clobetasol ointment 0.05 % Commonly known as: TEMOVATE Apply 1  application topically 2 (two) times daily as needed (for itching).   CO Q 10 PO Take 1 capsule by mouth every evening.   docusate sodium 100 MG capsule Commonly known as: COLACE Take 1 capsule (100 mg total) by mouth 2 (two) times daily.   escitalopram 20 MG tablet Commonly known as: LEXAPRO Take 20 mg by mouth daily.   FISH OIL PO Take 1 capsule by mouth 2 (two) times daily.   glimepiride 4 MG tablet Commonly known as: AMARYL Take 4 mg by mouth 2 (two) times daily with a meal.   levothyroxine 100 MCG tablet Commonly known as: SYNTHROID Take 100 mcg by mouth daily.   losartan 25 MG tablet Commonly known as: COZAAR Take 25 mg by mouth daily.   metFORMIN 500 MG tablet Commonly known as: GLUCOPHAGE Take 1,000 mg by mouth 2  (two) times daily.   methocarbamol 500 MG tablet Commonly known as: ROBAXIN Take 1 tablet (500 mg total) by mouth 4 (four) times daily.   ondansetron 4 MG tablet Commonly known as: ZOFRAN Take 1 tablet (4 mg total) by mouth every 6 (six) hours as needed for nausea.   oxyCODONE 5 MG immediate release tablet Commonly known as: Roxicodone Take 1 tablet (5 mg total) by mouth every 4 (four) hours as needed for severe pain.   simvastatin 20 MG tablet Commonly known as: ZOCOR Take 20 mg by mouth at bedtime.       Diagnostic Studies: DG Ankle 2 Views Left  Result Date: 06/01/2019 CLINICAL DATA:  Post reduction EXAM: LEFT ANKLE - 2 VIEW COMPARISON:  Radiograph same day FINDINGS: There is been interval reduction of the ankle dislocation. There is approved alignment of tibiotalar joint with approximately 1 cm of medial clear space widening. Comminuted fracture of the posterior malleolus and medial malleolus is seen. Mildly displaced obliquely oriented distal fibular fracture. IMPRESSION: Interval reduction of tibiotalar dislocation with improved alignment. 1 cm medial clear space widening. Electronically Signed   By: Prudencio Pair M.D.   On: 06/01/2019 16:07   DG Ankle Complete Left  Result Date: 06/01/2019 CLINICAL DATA:  Left ankle pain and deformity after fall today. EXAM: LEFT ANKLE COMPLETE - 3+ VIEW COMPARISON:  None. FINDINGS: Severely displaced and comminuted distal left fibular fracture is noted. Severely displaced fractures are seen involving the medial and lateral portions of the distal tibia. There is severe lateral dislocation of the talus relative to the tibia. IMPRESSION: Severely displaced and comminuted distal left fibular fracture. Severely displaced fractures are seen involving the medial and lateral portions of the distal tibia with severe lateral dislocation of the talus relative to distal tibia. Electronically Signed   By: Marijo Conception M.D.   On: 06/01/2019 14:38   CT Ankle  Left Wo Contrast  Result Date: 06/01/2019 CLINICAL DATA:  Ankle trauma EXAM: CT OF THE LEFT ANKLE WITHOUT CONTRAST TECHNIQUE: Multidetector CT imaging of the left ankle was performed according to the standard protocol. Multiplanar CT image reconstructions were also generated. COMPARISON:  None. FINDINGS: Bones/Joint/Cartilage There is been interval reduction of the ankle dislocation. There is improved alignment of the distal tibia on the talus. However there is still approximately 1.1 cm of medial clear space widening. There is a comminuted mildly displaced medial malleolar fracture with still articulates with the talus. There is comminuted displaced posterior malleolar fracture with small fracture fragment seen in the posterior joint space. There is slight impaction of the posterior distal tibia with approximately 4 mm of overlying  articular surface depression mildly displaced obliquely oriented fracture of the distal fibula seen with small fracture fragments in the syndesmosis. No other fractures are identified. Calcaneal enthesophytes are noted. A small ankle joint effusion is seen. Ligaments Suboptimally assessed by CT. Muscles and Tendons The muscles surrounding the ankle appear to be grossly intact. The flexor and extensor tendons appear to be grossly intact. There is a os navicularae. the Achilles tendon is intact. Soft tissues Diffuse subcutaneous edema and soft tissue swelling seen surrounding the ankle. There is also heel pad inflammation. IMPRESSION: Interval improved alignment of the ankle dislocation, however approximately 1 cm of medial clear space widening. Comminuted medial and posterior malleolar fractures with overlying 4 mm articular surface depression at the distal posterior tibia. Mildly displaced distal fibular fracture. Electronically Signed   By: Prudencio Pair M.D.   On: 06/01/2019 15:57   DG Chest Portable 1 View  Result Date: 06/01/2019 CLINICAL DATA:  Post reduction left ankle EXAM:  PORTABLE CHEST 1 VIEW COMPARISON:  None. FINDINGS: The heart size and mediastinal contours are within normal limits. Both lungs are clear. Rounded calcifications seen overlying the right lateral chest wall. No acute osseous abnormality. IMPRESSION: No active disease. Electronically Signed   By: Prudencio Pair M.D.   On: 06/01/2019 16:06   [place] OR NERVE BLOCK-IMAGE ONLY [facility])  Result Date: 06/02/2019 There is no interpretation for this exam.  This order is for images obtained during a surgical procedure.  Please See "Surgeries" Tab for more information regarding the procedure.    Disposition: Discharge disposition: 03-Skilled Nursing Facility       Up with therapy  NWB left lower extremity OXycodone pain control Aspirin 81 daily for DVT prophylaxis X 30 days Discharge to SNF Follow up in Dr. Harlow Mares office February 12 or 15.  Call for appt [place]     Signed: Carlynn Spry ,PA-C 06/04/2019, 12:21 PM

## 2019-06-04 NOTE — Progress Notes (Signed)
Tele notified this nurse pt had a run of SVT, HR 145. PA Maurice notified.  Vitals (see flowsheet) No orders received.

## 2019-06-04 NOTE — Progress Notes (Signed)
  Subjective:  Patient reports pain as mild to moderate.    Objective:   VITALS:   Vitals:   06/03/19 0759 06/03/19 1625 06/04/19 0022 06/04/19 0745  BP: 104/62 (!) 106/56 (!) 112/58 109/65  Pulse: 64 73 66 61  Resp:   16 16  Temp: 98.1 F (36.7 C) 98.9 F (37.2 C) 98.1 F (36.7 C) 97.9 F (36.6 C)  TempSrc: Oral Oral Oral Oral  SpO2: 98% 95% 96% 94%  Weight:      Height:        PHYSICAL EXAM:  Neurologically intact ABD soft Neurovascular intact Sensation intact distally Intact pulses distally Dorsiflexion/Plantar flexion intact Incision: dressing C/D/I No cellulitis present Compartment soft  LABS  Results for orders placed or performed during the hospital encounter of 06/01/19 (from the past 24 hour(s))  Glucose, capillary     Status: Abnormal   Collection Time: 06/03/19  4:28 PM  Result Value Ref Range   Glucose-Capillary 172 (H) 70 - 99 mg/dL  Glucose, capillary     Status: Abnormal   Collection Time: 06/03/19  9:25 PM  Result Value Ref Range   Glucose-Capillary 205 (H) 70 - 99 mg/dL  Glucose, capillary     Status: Abnormal   Collection Time: 06/04/19  7:59 AM  Result Value Ref Range   Glucose-Capillary 130 (H) 70 - 99 mg/dL  Glucose, capillary     Status: Abnormal   Collection Time: 06/04/19 11:33 AM  Result Value Ref Range   Glucose-Capillary 166 (H) 70 - 99 mg/dL    No results found.  Assessment/Plan: 2 Days Post-Op   Principal Problem:   Closed left ankle fracture Active Problems:   History of TIA (transient ischemic attack)   HLD (hyperlipidemia)   Type 2 diabetes mellitus with hyperlipidemia (HCC)   History of right breast cancer   History of bladder cancer   Hypothyroidism   OSA on CPAP   Advance diet  Up with therapy  NWB left lower extremity OXycodone pain control Aspirin 81 daily for DVT prophylaxis X 30 days Discharge to SNF Follow up in Dr. Harlow Mares office February 12 or 15.  Call for appt [place]   Carlynn Spry , [place] 06/04/2019, 12:16 PM

## 2019-06-04 NOTE — Progress Notes (Signed)
PT Cancellation Note  Patient Details Name: Shelia Evans MRN: [id number] DOB: 1950/10/19   Cancelled Treatment:    Reason Eval/Treat Not Completed: Fatigue/lethargy limiting ability to participate   Pt in bed, lightly sleeping.  Stated she recently received pain medication and it is making her sleepy.  Offered exercises to help awaken pt but she declined stating she wanted to sleep.    Chesley Noon 06/04/2019, 10:38 AM

## 2019-06-04 NOTE — Care Management Important Message (Signed)
Important Message  Patient Details  Name: Shelia Evans MRN: [id number] Date of Birth: Apr 25, 1951   Medicare Important Message Given:  Yes     Juliann Pulse A Sahaj Bona 06/04/2019, 10:50 AM

## 2019-06-04 NOTE — Progress Notes (Signed)
PT Cancellation Note  Patient Details Name: Shelia Evans MRN: [id number] DOB: 1950/07/08   Cancelled Treatment:    Reason Eval/Treat Not Completed: Fatigue/lethargy limiting ability to participate   Pt in recliner, sleeping but awoke to voice.  Stated she has been up to commode and up to recliner with nursing.  She stated she has taken more pain medication this pm that is making her sleepy and she again defers therapy.  "Just let me be."     Chesley Noon 06/04/2019, 2:58 PM

## 2019-06-05 LAB — GLUCOSE, CAPILLARY
Glucose-Capillary: 116 mg/dL — ABNORMAL HIGH (ref 70–99)
Glucose-Capillary: 137 mg/dL — ABNORMAL HIGH (ref 70–99)
Glucose-Capillary: 127 mg/dL — ABNORMAL HIGH (ref 70–99)
Glucose-Capillary: 144 mg/dL — ABNORMAL HIGH (ref 70–99)

## 2019-06-05 MED ORDER — SODIUM CHLORIDE 0.9 % IV SOLN: INTRAVENOUS | Status: DC

## 2019-06-05 NOTE — Progress Notes (Signed)
Occupational Therapy Treatment Patient Details Name: Shelia Evans MRN: [id number] DOB: 03/08/1951 Today's Date: 06/05/2019    History of present illness 69 y/o female who fell while carrying boxes down steps and suffered L ankle fx 2/2.  s/p ORIF 2/3, now NWBing on L.  Reports acute on chronic anxiety related to current situation   OT comments  Pt seen for OT tx this date to f/u re: safety with self care ADLs/ADL mobility. Pt's mood and demeanor is much improved this date and she endorses feeling much better. Pt with very minimal c/o pain-only slightly in L LE with mobilization. Pt performs ADL transfer from recliner to stand with RW with MIN A, performs fxl mobility to restroom with CGA and MIN verbal cues to slow down for safety. Pt demos safe transfer technique to [facility] and requires MIN A-CGA for standing balance with peri care. In addition, pt performs fxl mobility with CGA to shower chair at sink-side to take sponge bath with MIN A for transfer to seat. Pt requires MIN A for LB bathing and dressing and setup only for UB ADLs including bathing. Pt demos good safety awareness throughout. OT educates re: use of reacher to thread LB clothing to L LE with good reception from pt. Pt demos good progress with OT goals, however, anticipate as pt lives alone and was highly indep with all ADLs/IADLs prior, SNF is still safest d/c destination for STR.    Follow Up Recommendations  SNF    Equipment Recommendations  3 in 1 bedside commode    Recommendations for Other Services      Precautions / Restrictions Precautions Precautions: Fall Restrictions Weight Bearing Restrictions: Yes LLE Weight Bearing: Non weight bearing       Mobility Bed Mobility Overal bed mobility: Needs Assistance Bed Mobility: Sit to Supine       Sit to supine: Min guard      Transfers Overall transfer level: Needs assistance Equipment used: Rolling walker (2 wheeled) Transfers: Sit to/from Stand Sit to  Stand: Min assist              Balance Overall balance assessment: Modified Independent;Needs assistance Sitting-balance support: Feet supported Sitting balance-Leahy Scale: Normal     Standing balance support: Bilateral upper extremity supported Standing balance-Leahy Scale: Fair Standing balance comment: CGA and UE use through RW for standing balance support.                           ADL either performed or assessed with clinical judgement   ADL                                         General ADL Comments: Pt able to perfrom UB bathing seated with setup. Performs LB bathing with MIN A to CGA for sit to/from stand from shower seat at sink-side. Pt able to don sock to R LE with setup, unable to simulate threading garments over L LE, educated on reacher use, overall requiring MIN A with LB dressing. Pt completes UB dressing with setup. Requires CGA for BSC transfer with RW and for standing balance with peri care. Performs fxl mobility to/from restroom with RW with CGA.     Vision Baseline Vision/History: Wears glasses Wears Glasses: Reading only Patient Visual Report: No change from baseline     Perception     Praxis  Cognition Arousal/Alertness: Awake/alert Behavior During Therapy: WFL for tasks assessed/performed Overall Cognitive Status: Within Functional Limits for tasks assessed                                          Exercises Other Exercises Other Exercises: OT facilitates education re: modified dressing techniques for threading pants/underwear to L LE. Pt with good reception of education.   Shoulder Instructions       General Comments      Pertinent Vitals/ Pain       Pain Assessment: Faces Faces Pain Scale: Hurts a little bit Pain Location: L LE with mobilization Pain Descriptors / Indicators: Aching Pain Intervention(s): Monitored during session;Repositioned  Home Living                                           Prior Functioning/Environment              Frequency  Min 2X/week        Progress Toward Goals  OT Goals(current goals can now be found in the care plan section)  Progress towards OT goals: Progressing toward goals  Acute Rehab OT Goals Patient Stated Goal: not need help from others to do day to day things OT Goal Formulation: With patient Time For Goal Achievement: 06/17/19 Potential to Achieve Goals: Good  Plan Discharge plan remains appropriate    Co-evaluation                 AM-PAC OT "6 Clicks" Daily Activity     Outcome Measure   Help from another person eating meals?: None Help from another person taking care of personal grooming?: A Little Help from another person toileting, which includes using toliet, bedpan, or urinal?: A Little Help from another person bathing (including washing, rinsing, drying)?: A Little Help from another person to put on and taking off regular upper body clothing?: A Little Help from another person to put on and taking off regular lower body clothing?: A Little 6 Click Score: 19    End of Session Equipment Utilized During Treatment: Gait belt;Rolling walker  OT Visit Diagnosis: Unsteadiness on feet (R26.81);History of falling (Z91.81)   Activity Tolerance Patient tolerated treatment well   Patient Left in bed;with call bell/phone within reach;with bed alarm set   Nurse Communication Mobility status        Time: [id number] OT Time Calculation (min): 39 min  Charges: OT Treatments $Self Care/Home Management : 23-37 mins $Therapeutic Activity: 8-22 mins  Gerrianne Scale, MS, OTR/L ascom [phone] 06/05/19, 11:26 AM

## 2019-06-05 NOTE — Progress Notes (Signed)
Subjective: 3 Days Post-Op Procedure(s) (LRB): OPEN REDUCTION INTERNAL FIXATION (ORIF) ANKLE FRACTURE (Left)   Patient alert and oriented.  She is not complaining of significant pain.  She has started participating with PT.  Plan is for skilled nursing temporarily.  Discharge orders are already done and she is awaiting on bed placement.  Patient reports pain as mild.  Objective:   VITALS:   Vitals:   06/04/19 2330 06/05/19 0738  BP: 112/68 129/79  Pulse: 62 60  Resp: 16   Temp: 97.9 F (36.6 C) 98.5 F (36.9 C)  SpO2: 95% 97%    Neurologically intact ABD soft Neurovascular intact Sensation intact distally Intact pulses distally Dorsiflexion/Plantar flexion intact Incision: dressing C/D/I  LABS No results for input(s): HGB, HCT, WBC, PLT in the last 72 hours.  No results for input(s): NA, K, BUN, CREATININE, GLUCOSE in the last 72 hours.  No results for input(s): LABPT, INR in the last 72 hours.   Assessment/Plan: 3 Days Post-Op Procedure(s) (LRB): OPEN REDUCTION INTERNAL FIXATION (ORIF) ANKLE FRACTURE (Left)   Advance diet Up with therapy Discharge to SNF

## 2019-06-05 NOTE — TOC Progression Note (Signed)
CSW spoke with Otila Kluver in admissions at [facility]. Otila Kluver reports patient's insurance authorization is still pending.  Stephanie Acre, LCSW-A Clinical Social Worker

## 2019-06-05 NOTE — Progress Notes (Signed)
Physical Therapy Treatment Patient Details Name: Shelia Evans MRN: [id number] DOB: [birth year] Today's Date: 06/05/2019    History of Present Illness 69 y/o female who fell while carrying boxes down steps and suffered L ankle fx 2/2.  s/p ORIF 2/3, now NWBing on L.  Reports acute on chronic anxiety related to current situation    PT Comments    Patient tolerated treatment well overall but was limited by fatigue she associated with just feeling "off" since surgery and recent medications she took. She also was limited by feeling of nausea during upright and prone exercises. Patient appeared motivated to complete activities but declined to attempt ambulation despite several attempts to encourage her to do so. Nursing came to check at end of session, stating her blood pressure had been quite low, which was noted by PT prior to session. Patient was monitored for safety throughout session and her nausea and discomfort may have been related to her low blood pressure. Patient was able to advance to more challenging functional strengthening exercises (repeated sit <> stand) with less assistance and was provided with a HEP for open chain hip strengthening to help her have adequate strength in the hip for future weight bearing and return to normal gait. She did not require more than CGA for safety this session. Patient would benefit from continued skilled physical therapy to address remaining impairments and functional limitations to work towards stated goals and return to PLOF or maximal functional independence.       Follow Up Recommendations  SNF     Equipment Recommendations  Rolling walker with 5" wheels    Recommendations for Other Services       Precautions / Restrictions Precautions Precautions: Fall Restrictions Weight Bearing Restrictions: Yes LLE Weight Bearing: Non weight bearing    Mobility  Bed Mobility Overal bed mobility: Needs Assistance Bed Mobility: Sit to Supine;Supine  to Sit     Supine to sit: Modified independent (Device/Increase time) Sit to supine: Modified independent (Device/Increase time)   General bed mobility comments: patient able to get to edge of bed and back without assistance  Transfers Overall transfer level: Needs assistance Equipment used: Rolling walker (2 wheeled) Transfers: Sit to/from Stand Sit to Stand: Min guard;Supervision         General transfer comment: patient completed several sit <> stand transfers at edge of bed (see exercise below) with CGA - supervision using RW and vc's for proper hand placement.  Ambulation/Gait   Gait Distance (Feet): (declined to attempt gait this session; cites amb to bathroom earlier in the day; states she does not feel well in standing)             Stairs             Wheelchair Mobility    Modified Rankin (Stroke Patients Only)       Balance Overall balance assessment: Modified Independent;Needs assistance Sitting-balance support: Feet supported Sitting balance-Leahy Scale: Normal     Standing balance support: Bilateral upper extremity supported Standing balance-Leahy Scale: Fair Standing balance comment: CGA and UE use through RW for standing balance support.                            Cognition Arousal/Alertness: Awake/alert Behavior During Therapy: WFL for tasks assessed/performed Overall Cognitive Status: Within Functional Limits for tasks assessed  General Comments: feeling quite fatigued, nausea with upright exertion      Exercises Other Exercises Other Exercises: Sit <> stand from edge of bed at lowest setting, x5, x10 using BUE (one on bed, one on RW) and SBA - CGA. Patient limited by fatigue, feeling nausea, and left hip cramping. Other Exercises: Seated exercises for strength: L LAQ x 20, marching 2 x 20 each side, isometric hip abduction and adduction x 20 each against manual  resistance. Other Exercises: Bed exercises for hip strenthening: sidelying L hip abduction 3x10, prone L hip extension 3x10. Other Exercises: education about importance of keeping hips strong for future return to weight bearing.    General Comments        Pertinent Vitals/Pain Faces Pain Scale: Hurts a little bit Pain Location: L LE with mobilization Pain Descriptors / Indicators: Aching Pain Intervention(s): Limited activity within patient's tolerance;Repositioned    Home Living                      Prior Function            PT Goals (current goals can now be found in the care plan section) Acute Rehab PT Goals Patient Stated Goal: not need help from others to do day to day things PT Goal Formulation: With patient Time For Goal Achievement: 06/17/19 Potential to Achieve Goals: Fair Progress towards PT goals: Progressing toward goals    Frequency    Min 2X/week      PT Plan Current plan remains appropriate    Co-evaluation              AM-PAC PT "6 Clicks" Mobility   Outcome Measure  Help needed turning from your back to your side while in a flat bed without using bedrails?: None Help needed moving from lying on your back to sitting on the side of a flat bed without using bedrails?: None Help needed moving to and from a bed to a chair (including a wheelchair)?: A Little Help needed standing up from a chair using your arms (e.g., wheelchair or bedside chair)?: A Little Help needed to walk in hospital room?: A Lot Help needed climbing 3-5 steps with a railing? : Total 6 Click Score: 17    End of Session Equipment Utilized During Treatment: Gait belt Activity Tolerance: Patient limited by fatigue;Patient tolerated treatment well;Other (comment)(limited by nausea) Patient left: with bed alarm set;with call bell/phone within reach;in bed;with SCD's reapplied(SCD on R LE only (as found)) Nurse Communication: Mobility status;Other (comment)(nausea) PT  Visit Diagnosis: Muscle weakness (generalized) (M62.81);Difficulty in walking, not elsewhere classified (R26.2)     Time: 1520-1600 PT Time Calculation (min) (ACUTE ONLY): 40 min  Charges:  $Therapeutic Exercise: 23-37 mins $Therapeutic Activity: 8-22 mins                     Everlean Alstrom. Graylon Good, PT, DPT 06/05/19, 5:52 PM

## 2019-06-05 NOTE — Plan of Care (Signed)

## 2019-06-06 LAB — GLUCOSE, CAPILLARY
Glucose-Capillary: 146 mg/dL — ABNORMAL HIGH (ref 70–99)
Glucose-Capillary: 159 mg/dL — ABNORMAL HIGH (ref 70–99)
Glucose-Capillary: 111 mg/dL — ABNORMAL HIGH (ref 70–99)
Glucose-Capillary: 184 mg/dL — ABNORMAL HIGH (ref 70–99)

## 2019-06-06 MED ORDER — ALUM HYDROXIDE-MAG TRISILICATE 80-20 MG PO CHEW: 2.0000 | CHEWABLE_TABLET | Freq: Three times a day (TID) | ORAL | Status: DC | PRN

## 2019-06-06 MED ORDER — ALUM & MAG HYDROXIDE-SIMETH 200-200-20 MG/5ML PO SUSP
30.0000 mL | Freq: Three times a day (TID) | ORAL | Status: DC | PRN
  Administered 2019-06-07: 30 mL via ORAL
  Filled 2019-06-06: qty 30

## 2019-06-06 NOTE — TOC Progression Note (Signed)
Transition of Care [facility]) - Progression Note    Patient Details  Name: AMRIT TILMON MRN: [id number] Date of Birth: 03/09/1951  Transition of Care [facility]) CM/SW Contact  Boris Sharper, LCSW Phone Number:[phone] 06/06/2019, 3:07 PM  Clinical Narrative:     Pt accepted into [facility] and medically stable for discharge awaiting covid test results to be discharged. Pt will be in room 709 call to report number [phone]       Expected Discharge Plan and Services           Expected Discharge Date: 06/06/19                                     Social Determinants of Health (SDOH) Interventions    Readmission Risk Interventions No flowsheet data found.

## 2019-06-06 NOTE — Plan of Care (Signed)
Patient denies and with no s/s of distress noted. Call bell in reach.

## 2019-06-06 NOTE — Progress Notes (Signed)
Subjective: 4 Days Post-Op Procedure(s) (LRB): OPEN REDUCTION INTERNAL FIXATION (ORIF) ANKLE FRACTURE (Left)   Patient is feeling better today.  Work with PT.  Plan is for her to go [facility] tomorrow.  CSM is good distally.  Patient reports pain as mild.  Objective:   VITALS:   Vitals:   06/06/19 0809 06/06/19 1542  BP: 118/69 112/72  Pulse: 62 65  Resp:  20  Temp: (!) 97.5 F (36.4 C) (!) 97.5 F (36.4 C)  SpO2: 99% 99%    Neurologically intact ABD soft Neurovascular intact Sensation intact distally Intact pulses distally Dorsiflexion/Plantar flexion intact Incision: dressing C/D/I  LABS No results for input(s): HGB, HCT, WBC, PLT in the last 72 hours.  No results for input(s): NA, K, BUN, CREATININE, GLUCOSE in the last 72 hours.  No results for input(s): LABPT, INR in the last 72 hours.   Assessment/Plan: 4 Days Post-Op Procedure(s) (LRB): OPEN REDUCTION INTERNAL FIXATION (ORIF) ANKLE FRACTURE (Left)   Advance diet Up with therapy Discharge to SNF tomorrow.  Return to clinic 2 weeks.

## 2019-06-06 NOTE — Progress Notes (Signed)
Physical Therapy Treatment Patient Details Name: Shelia Evans MRN: [id number] DOB: March 14, 1951 Today's Date: 06/06/2019    History of Present Illness 69 y/o female who fell while carrying boxes down steps and suffered L ankle fx 2/2.  s/p ORIF 2/3, now NWBing on L.  Reports acute on chronic anxiety related to current situation    PT Comments    Patient agrees to PT treatment. She performs bed mobility with MI. She transfers sit to stand with min guard and RW. She ambulates NWB LLE with RW 15 feet hopping and min guard. She has reports of mod pain in L ankle during ambulation but is decreases with elevation in the bed. She will continue to benefit from skilled PT to improve mobility and strength..   Follow Up Recommendations  SNF     Equipment Recommendations  Rolling walker with 5" wheels    Recommendations for Other Services       Precautions / Restrictions Restrictions LLE Weight Bearing: Non weight bearing    Mobility  Bed Mobility Overal bed mobility: Modified Independent Bed Mobility: Supine to Sit;Sit to Supine     Supine to sit: Modified independent (Device/Increase time) Sit to supine: Modified independent (Device/Increase time)      Transfers Overall transfer level: Needs assistance Equipment used: Rolling walker (2 wheeled) Transfers: Sit to/from Stand Sit to Stand: Min guard         General transfer comment: needs VC for safety and sequencing  Ambulation/Gait Ambulation/Gait assistance: Min guard Gait Distance (Feet): 10 Feet Assistive device: Rolling walker (2 wheeled) Gait Pattern/deviations: (hopping)     General Gait Details: (hopping)   Stairs             Wheelchair Mobility    Modified Rankin (Stroke Patients Only)       Balance Overall balance assessment: Modified Independent;Needs assistance Sitting-balance support: Feet supported Sitting balance-Leahy Scale: Normal     Standing balance support: Bilateral upper  extremity supported Standing balance-Leahy Scale: Fair Standing balance comment: CGA and UE use through RW for standing balance support.                            Cognition Arousal/Alertness: Awake/alert Behavior During Therapy: WFL for tasks assessed/performed                                          Exercises      General Comments        Pertinent Vitals/Pain Pain Assessment: 0-10 Pain Score: 5  Pain Location: (left foot) Pain Intervention(s): Limited activity within patient's tolerance;Monitored during session    Home Living                      Prior Function            PT Goals (current goals can now be found in the care plan section) Acute Rehab PT Goals Patient Stated Goal: not need help from others to do day to day things Progress towards PT goals: Progressing toward goals    Frequency    Min 2X/week      PT Plan Current plan remains appropriate    Co-evaluation              AM-PAC PT "6 Clicks" Mobility   Outcome Measure  Help needed turning from your back  to your side while in a flat bed without using bedrails?: None Help needed moving from lying on your back to sitting on the side of a flat bed without using bedrails?: None Help needed moving to and from a bed to a chair (including a wheelchair)?: A Little Help needed standing up from a chair using your arms (e.g., wheelchair or bedside chair)?: A Little Help needed to walk in hospital room?: A Lot Help needed climbing 3-5 steps with a railing? : Total 6 Click Score: 17    End of Session Equipment Utilized During Treatment: Gait belt Activity Tolerance: Patient limited by fatigue;Patient tolerated treatment well;Other (comment)(limited by nausea) Patient left: with bed alarm set;with call bell/phone within reach;in bed;with SCD's reapplied(SCD on R LE only (as found)) Nurse Communication: Mobility status;Other (comment)(nausea) PT Visit Diagnosis:  Muscle weakness (generalized) (M62.81);Difficulty in walking, not elsewhere classified (R26.2)     Time: [id number] PT Time Calculation (min) (ACUTE ONLY): 30 min  Charges:  $Gait Training: 8-22 mins $Therapeutic Activity: 8-22 mins                        Arelia Sneddon S , PT DPT 06/06/2019, 10:08 AM

## 2019-06-06 NOTE — Plan of Care (Signed)
Patient resting quietly. Denies pain or discomfort.  Awaiting covid swab results and bm for discharge to [facility]. Call bell in reach. Will continue to assess.

## 2019-06-06 NOTE — Discharge Summary (Signed)
Physician Discharge Summary  Patient ID: Shelia Evans MRN: [id number] DOB/AGE: 11/20/1950 69 y.o.  Admit date: 06/01/2019 Discharge date: 06/06/2019  Admission Diagnoses:  Left Ankle Fracture Closed left ankle fracture  Discharge Diagnoses:  Left Ankle Fracture Principal Problem:   Closed left ankle fracture Active Problems:   History of TIA (transient ischemic attack)   HLD (hyperlipidemia)   Type 2 diabetes mellitus with hyperlipidemia (HCC)   History of right breast cancer   History of bladder cancer   Hypothyroidism   OSA on CPAP   Past Medical History:  Diagnosis Date  . Bladder cancer ([place]) 2006  . Breast cancer ([place]) 2008   RT LUMPECTOMY  . Personal history of radiation therapy 2008   right breast  . S/P chemotherapy, time since greater than 12 weeks 2006   BLADDER CA  . S/P radiation > 12 weeks 2008   BREAST CA    Surgeries: Procedure(s): OPEN REDUCTION INTERNAL FIXATION (ORIF) ANKLE FRACTURE on 06/02/2019   Consultants (if any):   Discharged Condition: Improved  Hospital Course: Shelia Evans is an 69 y.o. female who was admitted 06/01/2019 with a diagnosis of  Left Ankle Fracture Closed left ankle fracture and went to the operating room on 06/02/2019 and underwent the above named procedures.    She was given perioperative antibiotics:  Anti-infectives (From admission, onward)   Start     Dose/Rate Route Frequency Ordered Stop   06/02/19 1320  50,000 units bacitracin in 0.9% normal saline 250 mL irrigation  Status:  Discontinued       As needed 06/02/19 1357 06/02/19 1520   06/02/19 0832  ceFAZolin (ANCEF) IVPB 2g/100 mL premix     2 g 200 mL/hr over 30 Minutes Intravenous 30 min pre-op 06/02/19 0832 06/02/19 1339    .  She was given sequential compression devices, early ambulation, and aspirin for DVT prophylaxis.  She benefited maximally from the hospital stay and there were no complications.    Recent vital signs:  Vitals:   06/05/19 2318  06/06/19 0809  BP: 108/62 118/69  Pulse: 67 62  Resp: 16   Temp: 98.4 F (36.9 C) (!) 97.5 F (36.4 C)  SpO2: 95% 99%    Recent laboratory studies:  Lab Results  Component Value Date   HGB 12.4 06/01/2019   HGB 12.6 12/10/2016   HGB 12.5 12/10/2016   Lab Results  Component Value Date   WBC 12.9 (H) 06/01/2019   PLT 252 06/01/2019   Lab Results  Component Value Date   INR 0.86 12/10/2016   Lab Results  Component Value Date   NA 141 06/01/2019   K 4.5 06/01/2019   CL 105 06/01/2019   CO2 25 06/01/2019   BUN 19 06/01/2019   CREATININE 0.82 06/01/2019   GLUCOSE 153 (H) 06/01/2019    Discharge Medications:   Allergies as of 06/06/2019      Reactions   Lisinopril Rash   Meloxicam Other (See Comments), Hives   Blisters Caused blisters    Sulfa Antibiotics Rash   Codeine Other (See Comments)   Headache   Mitomycin Other (See Comments)   Blisters      Medication List    TAKE these medications   aspirin 81 MG EC tablet Take 1 tablet (81 mg total) by mouth daily.   busPIRone 5 MG tablet Commonly known as: BUSPAR Take 5 mg by mouth 2 (two) times daily.   clobetasol ointment 0.05 % Commonly known as: TEMOVATE Apply 1  application topically 2 (two) times daily as needed (for itching).   CO Q 10 PO Take 1 capsule by mouth every evening.   docusate sodium 100 MG capsule Commonly known as: COLACE Take 1 capsule (100 mg total) by mouth 2 (two) times daily.   escitalopram 20 MG tablet Commonly known as: LEXAPRO Take 20 mg by mouth daily.   FISH OIL PO Take 1 capsule by mouth 2 (two) times daily.   glimepiride 4 MG tablet Commonly known as: AMARYL Take 4 mg by mouth 2 (two) times daily with a meal.   levothyroxine 100 MCG tablet Commonly known as: SYNTHROID Take 100 mcg by mouth daily.   losartan 25 MG tablet Commonly known as: COZAAR Take 25 mg by mouth daily.   metFORMIN 500 MG tablet Commonly known as: GLUCOPHAGE Take 1,000 mg by mouth 2 (two)  times daily.   methocarbamol 500 MG tablet Commonly known as: ROBAXIN Take 1 tablet (500 mg total) by mouth 4 (four) times daily.   ondansetron 4 MG tablet Commonly known as: ZOFRAN Take 1 tablet (4 mg total) by mouth every 6 (six) hours as needed for nausea.   oxyCODONE 5 MG immediate release tablet Commonly known as: Roxicodone Take 1 tablet (5 mg total) by mouth every 4 (four) hours as needed for severe pain.   simvastatin 20 MG tablet Commonly known as: ZOCOR Take 20 mg by mouth at bedtime.       Diagnostic Studies: DG Ankle 2 Views Left  Result Date: 06/01/2019 CLINICAL DATA:  Post reduction EXAM: LEFT ANKLE - 2 VIEW COMPARISON:  Radiograph same day FINDINGS: There is been interval reduction of the ankle dislocation. There is approved alignment of tibiotalar joint with approximately 1 cm of medial clear space widening. Comminuted fracture of the posterior malleolus and medial malleolus is seen. Mildly displaced obliquely oriented distal fibular fracture. IMPRESSION: Interval reduction of tibiotalar dislocation with improved alignment. 1 cm medial clear space widening. Electronically Signed   By: Prudencio Pair M.D.   On: 06/01/2019 16:07   DG Ankle Complete Left  Result Date: 06/01/2019 CLINICAL DATA:  Left ankle pain and deformity after fall today. EXAM: LEFT ANKLE COMPLETE - 3+ VIEW COMPARISON:  None. FINDINGS: Severely displaced and comminuted distal left fibular fracture is noted. Severely displaced fractures are seen involving the medial and lateral portions of the distal tibia. There is severe lateral dislocation of the talus relative to the tibia. IMPRESSION: Severely displaced and comminuted distal left fibular fracture. Severely displaced fractures are seen involving the medial and lateral portions of the distal tibia with severe lateral dislocation of the talus relative to distal tibia. Electronically Signed   By: Marijo Conception M.D.   On: 06/01/2019 14:38   CT Ankle Left Wo  Contrast  Result Date: 06/01/2019 CLINICAL DATA:  Ankle trauma EXAM: CT OF THE LEFT ANKLE WITHOUT CONTRAST TECHNIQUE: Multidetector CT imaging of the left ankle was performed according to the standard protocol. Multiplanar CT image reconstructions were also generated. COMPARISON:  None. FINDINGS: Bones/Joint/Cartilage There is been interval reduction of the ankle dislocation. There is improved alignment of the distal tibia on the talus. However there is still approximately 1.1 cm of medial clear space widening. There is a comminuted mildly displaced medial malleolar fracture with still articulates with the talus. There is comminuted displaced posterior malleolar fracture with small fracture fragment seen in the posterior joint space. There is slight impaction of the posterior distal tibia with approximately 4 mm of overlying  articular surface depression mildly displaced obliquely oriented fracture of the distal fibula seen with small fracture fragments in the syndesmosis. No other fractures are identified. Calcaneal enthesophytes are noted. A small ankle joint effusion is seen. Ligaments Suboptimally assessed by CT. Muscles and Tendons The muscles surrounding the ankle appear to be grossly intact. The flexor and extensor tendons appear to be grossly intact. There is a os navicularae. the Achilles tendon is intact. Soft tissues Diffuse subcutaneous edema and soft tissue swelling seen surrounding the ankle. There is also heel pad inflammation. IMPRESSION: Interval improved alignment of the ankle dislocation, however approximately 1 cm of medial clear space widening. Comminuted medial and posterior malleolar fractures with overlying 4 mm articular surface depression at the distal posterior tibia. Mildly displaced distal fibular fracture. Electronically Signed   By: Prudencio Pair M.D.   On: 06/01/2019 15:57   DG Chest Portable 1 View  Result Date: 06/01/2019 CLINICAL DATA:  Post reduction left ankle EXAM: PORTABLE  CHEST 1 VIEW COMPARISON:  None. FINDINGS: The heart size and mediastinal contours are within normal limits. Both lungs are clear. Rounded calcifications seen overlying the right lateral chest wall. No acute osseous abnormality. IMPRESSION: No active disease. Electronically Signed   By: Prudencio Pair M.D.   On: 06/01/2019 16:06   [place] OR NERVE BLOCK-IMAGE ONLY [facility])  Result Date: 06/02/2019 There is no interpretation for this exam.  This order is for images obtained during a surgical procedure.  Please See "Surgeries" Tab for more information regarding the procedure.    Disposition: Discharge disposition: 03-Skilled Nursing Facility         Contact information for after-discharge care    Destination    [facility] Preferred SNF .   Service: Skilled Nursing Contact information: [address] [place] [phone]               Signed: Lovell Sheehan ,MD 06/06/2019, 2:33 PM

## 2019-06-07 LAB — GLUCOSE, CAPILLARY
Glucose-Capillary: 150 mg/dL — ABNORMAL HIGH (ref 70–99)
Glucose-Capillary: 170 mg/dL — ABNORMAL HIGH (ref 70–99)

## 2019-06-07 LAB — SARS CORONAVIRUS 2 (TAT 6-24 HRS): SARS Coronavirus 2: NEGATIVE

## 2019-06-07 NOTE — Care Management Important Message (Signed)
Important Message  Patient Details  Name: Shelia Evans MRN: [id number] Date of Birth: 1951-03-22   Medicare Important Message Given:  Yes     Juliann Pulse A Keiri Solano 06/07/2019, 11:30 AM

## 2019-06-07 NOTE — Discharge Summary (Signed)
Physician Discharge Summary  Patient ID: Shelia Evans MRN: [id number] DOB/AGE: 1950-05-11 69 y.o.  Admit date: 06/01/2019 Discharge date: 06/07/2019  Admission Diagnoses:  Left Ankle Fracture Closed left ankle fracture  Discharge Diagnoses:  Left Ankle Fracture Principal Problem:   Closed left ankle fracture Active Problems:   History of TIA (transient ischemic attack)   HLD (hyperlipidemia)   Type 2 diabetes mellitus with hyperlipidemia (HCC)   History of right breast cancer   History of bladder cancer   Hypothyroidism   OSA on CPAP   Past Medical History:  Diagnosis Date  . Bladder cancer ([place]) 2006  . Breast cancer ([place]) 2008   RT LUMPECTOMY  . Personal history of radiation therapy 2008   right breast  . S/P chemotherapy, time since greater than 12 weeks 2006   BLADDER CA  . S/P radiation > 12 weeks 2008   BREAST CA    Surgeries: Procedure(s): OPEN REDUCTION INTERNAL FIXATION (ORIF) ANKLE FRACTURE on 06/02/2019   Consultants (if any):   Discharged Condition: Improved  Hospital Course: Shelia Evans is an 69 y.o. female who was admitted 06/01/2019 with a diagnosis of  Left Ankle Fracture Closed left ankle fracture and went to the operating room on 06/02/2019 and underwent the above named procedures.    She was given perioperative antibiotics:  Anti-infectives (From admission, onward)   Start     Dose/Rate Route Frequency Ordered Stop   06/02/19 1320  50,000 units bacitracin in 0.9% normal saline 250 mL irrigation  Status:  Discontinued       As needed 06/02/19 1357 06/02/19 1520   06/02/19 0832  ceFAZolin (ANCEF) IVPB 2g/100 mL premix     2 g 200 mL/hr over 30 Minutes Intravenous 30 min pre-op 06/02/19 0832 06/02/19 1339    .  She was given sequential compression devices, early ambulation, and Aspirin for DVT prophylaxis.  She benefited maximally from the hospital stay and there were no complications.    Recent vital signs:  Vitals:   06/06/19 2322  06/07/19 0802  BP: (!) 109/57 (!) 105/54  Pulse: 65 67  Resp: 16 18  Temp: 97.6 F (36.4 C) 98.2 F (36.8 C)  SpO2: 93% 96%    Recent laboratory studies:  Lab Results  Component Value Date   HGB 12.4 06/01/2019   HGB 12.6 12/10/2016   HGB 12.5 12/10/2016   Lab Results  Component Value Date   WBC 12.9 (H) 06/01/2019   PLT 252 06/01/2019   Lab Results  Component Value Date   INR 0.86 12/10/2016   Lab Results  Component Value Date   NA 141 06/01/2019   K 4.5 06/01/2019   CL 105 06/01/2019   CO2 25 06/01/2019   BUN 19 06/01/2019   CREATININE 0.82 06/01/2019   GLUCOSE 153 (H) 06/01/2019    Discharge Medications:   Allergies as of 06/07/2019      Reactions   Lisinopril Rash   Meloxicam Other (See Comments), Hives   Blisters Caused blisters    Sulfa Antibiotics Rash   Codeine Other (See Comments)   Headache   Mitomycin Other (See Comments)   Blisters      Medication List    TAKE these medications   aspirin 81 MG EC tablet Take 1 tablet (81 mg total) by mouth daily.   busPIRone 5 MG tablet Commonly known as: BUSPAR Take 5 mg by mouth 2 (two) times daily.   clobetasol ointment 0.05 % Commonly known as: TEMOVATE Apply  1 application topically 2 (two) times daily as needed (for itching).   CO Q 10 PO Take 1 capsule by mouth every evening.   docusate sodium 100 MG capsule Commonly known as: COLACE Take 1 capsule (100 mg total) by mouth 2 (two) times daily.   escitalopram 20 MG tablet Commonly known as: LEXAPRO Take 20 mg by mouth daily.   FISH OIL PO Take 1 capsule by mouth 2 (two) times daily.   glimepiride 4 MG tablet Commonly known as: AMARYL Take 4 mg by mouth 2 (two) times daily with a meal.   levothyroxine 100 MCG tablet Commonly known as: SYNTHROID Take 100 mcg by mouth daily.   losartan 25 MG tablet Commonly known as: COZAAR Take 25 mg by mouth daily.   metFORMIN 500 MG tablet Commonly known as: GLUCOPHAGE Take 1,000 mg by mouth 2  (two) times daily.   methocarbamol 500 MG tablet Commonly known as: ROBAXIN Take 1 tablet (500 mg total) by mouth 4 (four) times daily.   ondansetron 4 MG tablet Commonly known as: ZOFRAN Take 1 tablet (4 mg total) by mouth every 6 (six) hours as needed for nausea.   oxyCODONE 5 MG immediate release tablet Commonly known as: Roxicodone Take 1 tablet (5 mg total) by mouth every 4 (four) hours as needed for severe pain.   simvastatin 20 MG tablet Commonly known as: ZOCOR Take 20 mg by mouth at bedtime.       Diagnostic Studies: DG Ankle 2 Views Left  Result Date: 06/01/2019 CLINICAL DATA:  Post reduction EXAM: LEFT ANKLE - 2 VIEW COMPARISON:  Radiograph same day FINDINGS: There is been interval reduction of the ankle dislocation. There is approved alignment of tibiotalar joint with approximately 1 cm of medial clear space widening. Comminuted fracture of the posterior malleolus and medial malleolus is seen. Mildly displaced obliquely oriented distal fibular fracture. IMPRESSION: Interval reduction of tibiotalar dislocation with improved alignment. 1 cm medial clear space widening. Electronically Signed   By: Prudencio Pair M.D.   On: 06/01/2019 16:07   DG Ankle Complete Left  Result Date: 06/01/2019 CLINICAL DATA:  Left ankle pain and deformity after fall today. EXAM: LEFT ANKLE COMPLETE - 3+ VIEW COMPARISON:  None. FINDINGS: Severely displaced and comminuted distal left fibular fracture is noted. Severely displaced fractures are seen involving the medial and lateral portions of the distal tibia. There is severe lateral dislocation of the talus relative to the tibia. IMPRESSION: Severely displaced and comminuted distal left fibular fracture. Severely displaced fractures are seen involving the medial and lateral portions of the distal tibia with severe lateral dislocation of the talus relative to distal tibia. Electronically Signed   By: Marijo Conception M.D.   On: 06/01/2019 14:38   CT Ankle  Left Wo Contrast  Result Date: 06/01/2019 CLINICAL DATA:  Ankle trauma EXAM: CT OF THE LEFT ANKLE WITHOUT CONTRAST TECHNIQUE: Multidetector CT imaging of the left ankle was performed according to the standard protocol. Multiplanar CT image reconstructions were also generated. COMPARISON:  None. FINDINGS: Bones/Joint/Cartilage There is been interval reduction of the ankle dislocation. There is improved alignment of the distal tibia on the talus. However there is still approximately 1.1 cm of medial clear space widening. There is a comminuted mildly displaced medial malleolar fracture with still articulates with the talus. There is comminuted displaced posterior malleolar fracture with small fracture fragment seen in the posterior joint space. There is slight impaction of the posterior distal tibia with approximately 4 mm of  overlying articular surface depression mildly displaced obliquely oriented fracture of the distal fibula seen with small fracture fragments in the syndesmosis. No other fractures are identified. Calcaneal enthesophytes are noted. A small ankle joint effusion is seen. Ligaments Suboptimally assessed by CT. Muscles and Tendons The muscles surrounding the ankle appear to be grossly intact. The flexor and extensor tendons appear to be grossly intact. There is a os navicularae. the Achilles tendon is intact. Soft tissues Diffuse subcutaneous edema and soft tissue swelling seen surrounding the ankle. There is also heel pad inflammation. IMPRESSION: Interval improved alignment of the ankle dislocation, however approximately 1 cm of medial clear space widening. Comminuted medial and posterior malleolar fractures with overlying 4 mm articular surface depression at the distal posterior tibia. Mildly displaced distal fibular fracture. Electronically Signed   By: Prudencio Pair M.D.   On: 06/01/2019 15:57   DG Chest Portable 1 View  Result Date: 06/01/2019 CLINICAL DATA:  Post reduction left ankle EXAM:  PORTABLE CHEST 1 VIEW COMPARISON:  None. FINDINGS: The heart size and mediastinal contours are within normal limits. Both lungs are clear. Rounded calcifications seen overlying the right lateral chest wall. No acute osseous abnormality. IMPRESSION: No active disease. Electronically Signed   By: Prudencio Pair M.D.   On: 06/01/2019 16:06   [place] OR NERVE BLOCK-IMAGE ONLY [facility])  Result Date: 06/02/2019 There is no interpretation for this exam.  This order is for images obtained during a surgical procedure.  Please See "Surgeries" Tab for more information regarding the procedure.    Disposition: Discharge disposition: 03-Skilled [place] information for follow-up providers    Lovell Sheehan, MD. Schedule an appointment as soon as possible for a visit.   Specialty: Orthopedic Surgery Why: Please make appointment before patient is discharged. Contact information: [place] [id number] [phone]            Contact information for after-discharge care    Destination    [facility] Preferred SNF .   Service: Skilled Nursing Contact information: [address] [place] [phone]                 Continue physical therapy NWB left lower extremity OXycodone pain control Aspirin 81 daily for DVT prophylaxis X 30 days Discharge to SNF Follow up in Dr. Harlow Mares office February 12 or 15. Call for appt [place]  Signed: Carlynn Spry ,PA-C 06/07/2019, 9:16 AM

## 2019-06-07 NOTE — TOC Transition Note (Signed)
Transition of Care [facility]) - CM/SW Discharge Note   Patient Details  Name: Shelia Evans MRN: [id number] Date of Birth: 1950-10-08  Transition of Care [facility]) CM/SW Contact:  Su Hilt, RN Phone Number: 06/07/2019, 8:42 AM   Clinical Narrative:     Patient to be discharged to [facility] room 709 today, The bedside nurse to call report to [phone], The patient's niece has been made aware of the discharge and stated understanding. The bedside nurse to call EMS for transport once ready  Final next level of care: Skilled Nursing Facility Barriers to Discharge: Barriers Resolved   Patient Goals and CMS Choice        Discharge Placement              Patient chooses bed at: [facility] [place] Patient to be transferred to facility by: EMS Name of family member notified: Nicki Niece Patient and family notified of of transfer: 06/07/19  Discharge Plan and Services                                     Social Determinants of Health (SDOH) Interventions     Readmission Risk Interventions No flowsheet data found.

## 2019-06-07 NOTE — Progress Notes (Signed)
Physical Therapy Treatment Patient Details Name: Shelia Evans MRN: [id number] DOB: Feb 06, 1951 Today's Date: 06/07/2019    History of Present Illness 69 y/o female who fell while carrying boxes down steps and suffered L ankle fx 2/2.  s/p ORIF 2/3, now NWBing on L.  Reports acute on chronic anxiety related to current situation    PT Comments    Participated in exercises as described below.  Pt stated she has been up x 2 with nursing today and does not want to get up at this time.  Education provided on HEP and increasing mobility daily.  Anticipates discharge to SNF today.   Follow Up Recommendations  SNF     Equipment Recommendations  Rolling walker with 5" wheels    Recommendations for Other Services       Precautions / Restrictions Restrictions Weight Bearing Restrictions: Yes LLE Weight Bearing: Non weight bearing          Cognition Arousal/Alertness: Awake/alert Behavior During Therapy: WFL for tasks assessed/performed Overall Cognitive Status: Within Functional Limits for tasks assessed                                        Exercises Other Exercises Other Exercises: supine ex x 10 for BLE toe wiggles, SLR, ab/add, heel slides and ankle pumps RLE x 10    General Comments        Pertinent Vitals/Pain Pain Assessment: No/denies pain Pain Intervention(s): Monitored during session           PT Goals (current goals can now be found in the care plan section) Progress towards PT goals: Progressing toward goals    Frequency    Min 2X/week      PT Plan Current plan remains appropriate    Co-evaluation              AM-PAC PT "6 Clicks" Mobility   Outcome Measure  Help needed turning from your back to your side while in a flat bed without using bedrails?: None Help needed moving from lying on your back to sitting on the side of a flat bed without using bedrails?: None Help needed moving to and from a bed to a chair (including  a wheelchair)?: A Little Help needed standing up from a chair using your arms (e.g., wheelchair or bedside chair)?: A Little Help needed to walk in hospital room?: A Little Help needed climbing 3-5 steps with a railing? : Total 6 Click Score: 18    End of Session   Activity Tolerance: Patient tolerated treatment well Patient left: with bed alarm set;with call bell/phone within reach;in bed;with nursing/sitter in room Nurse Communication: Mobility status;Other (comment)       Time: [id number] PT Time Calculation (min) (ACUTE ONLY): 9 min  Charges:  $Therapeutic Exercise: 8-22 mins                    Chesley Noon, PTA 06/07/19, 9:30 AM

## 2020-05-10 IMAGING — DX DG ANKLE COMPLETE 3+V*L*
3 series · 3 of 3 positions shown · non-contrast
Comparison: None.

CLINICAL DATA: Left ankle pain and deformity after fall today.

EXAM:
LEFT ANKLE COMPLETE - 3+ VIEW

[ankle ap]
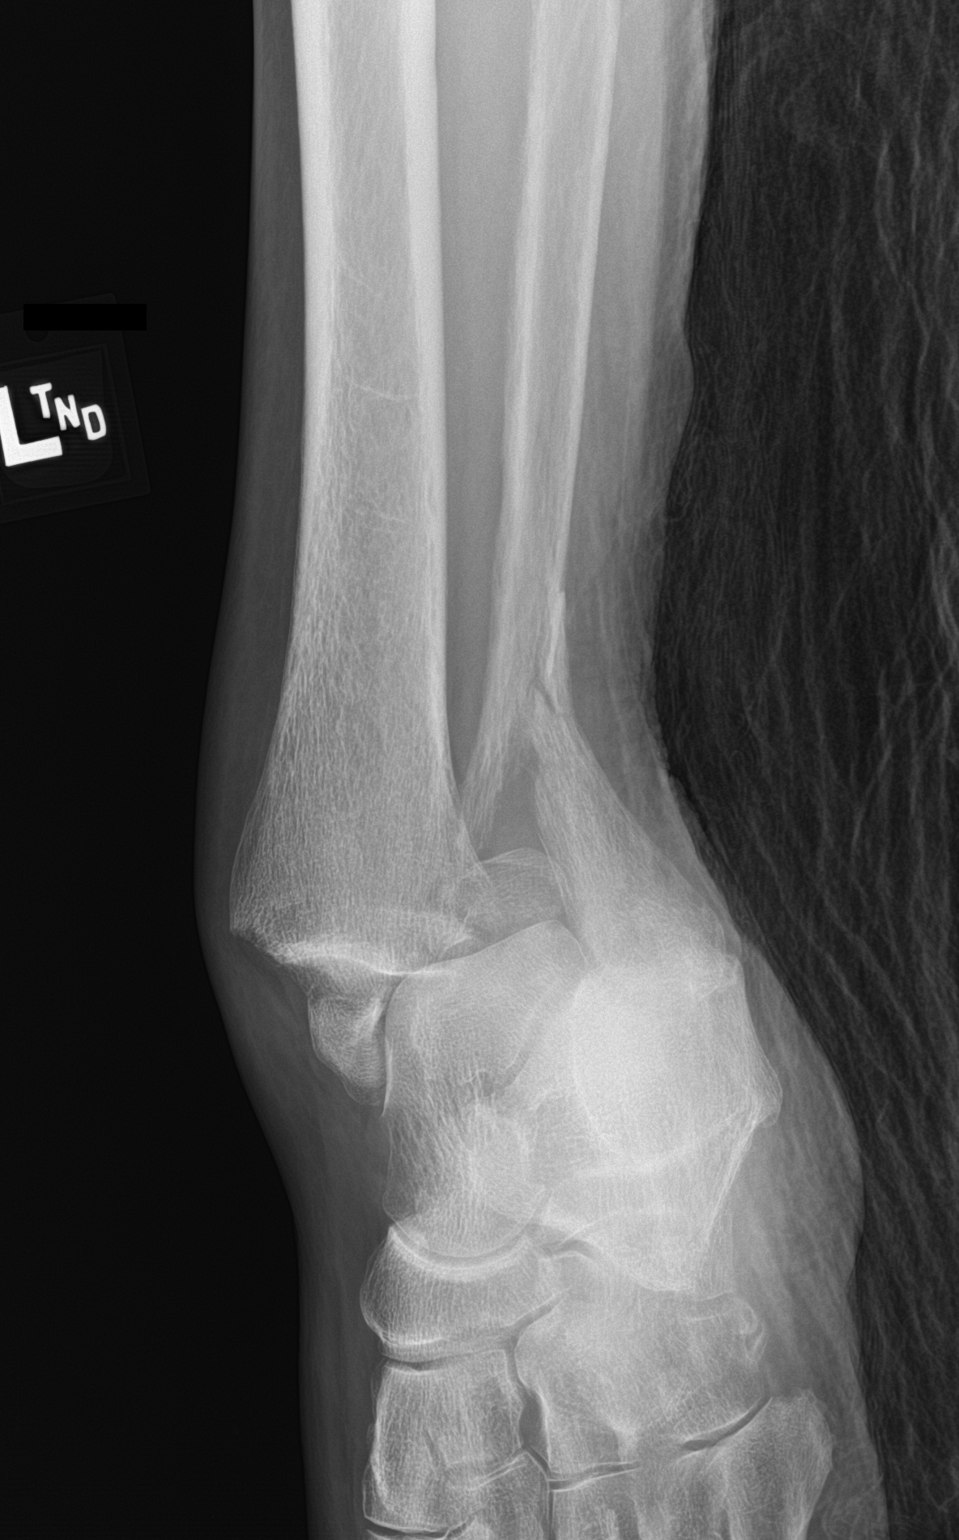

[ankle obl]
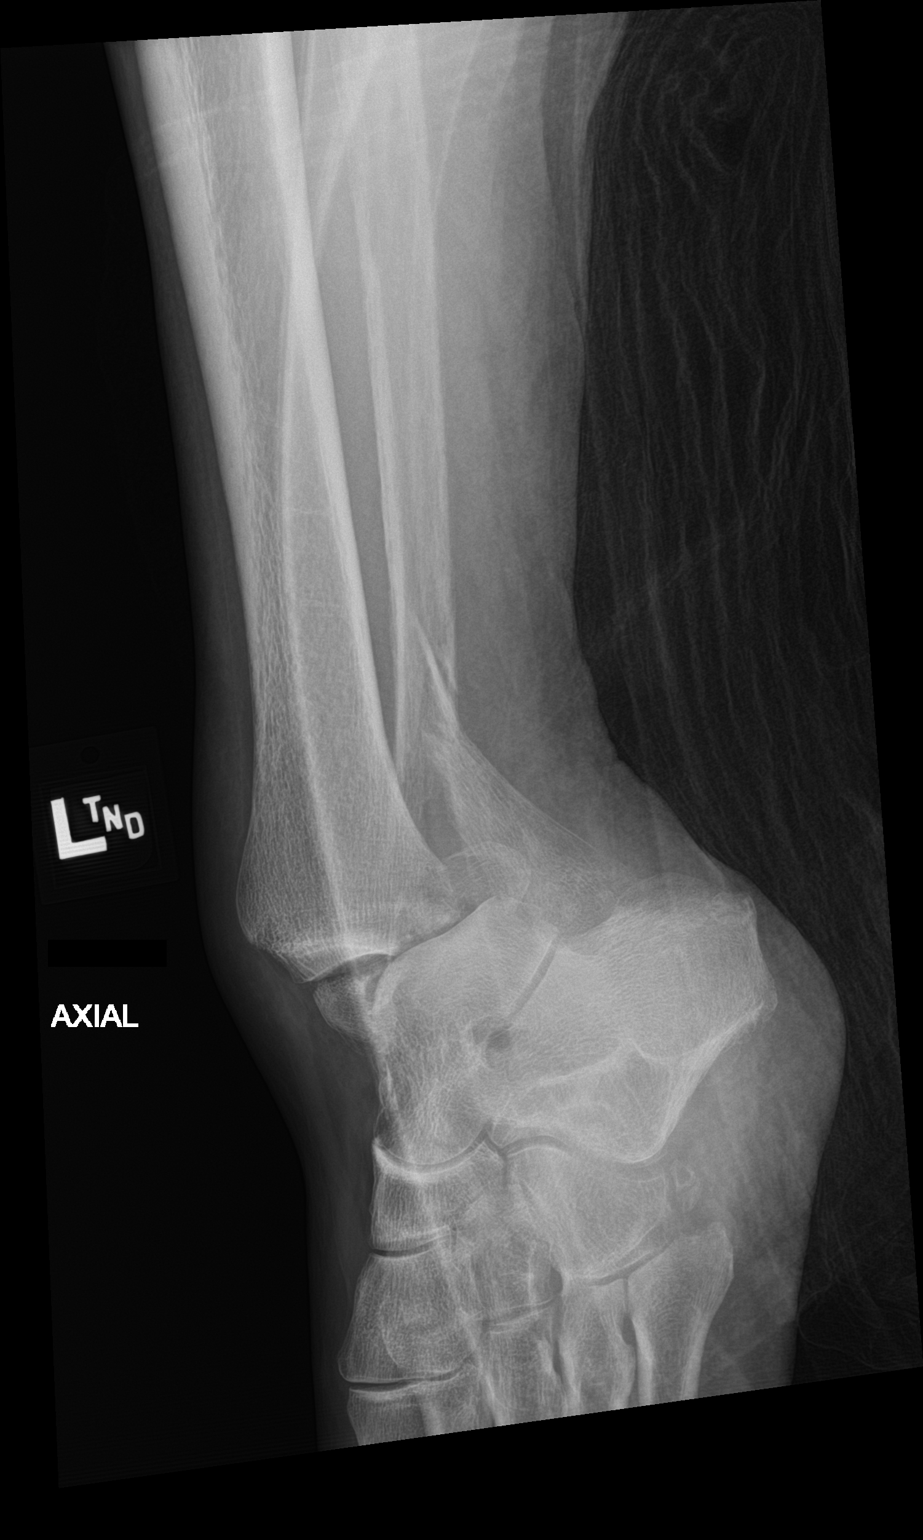

[ankle lat]
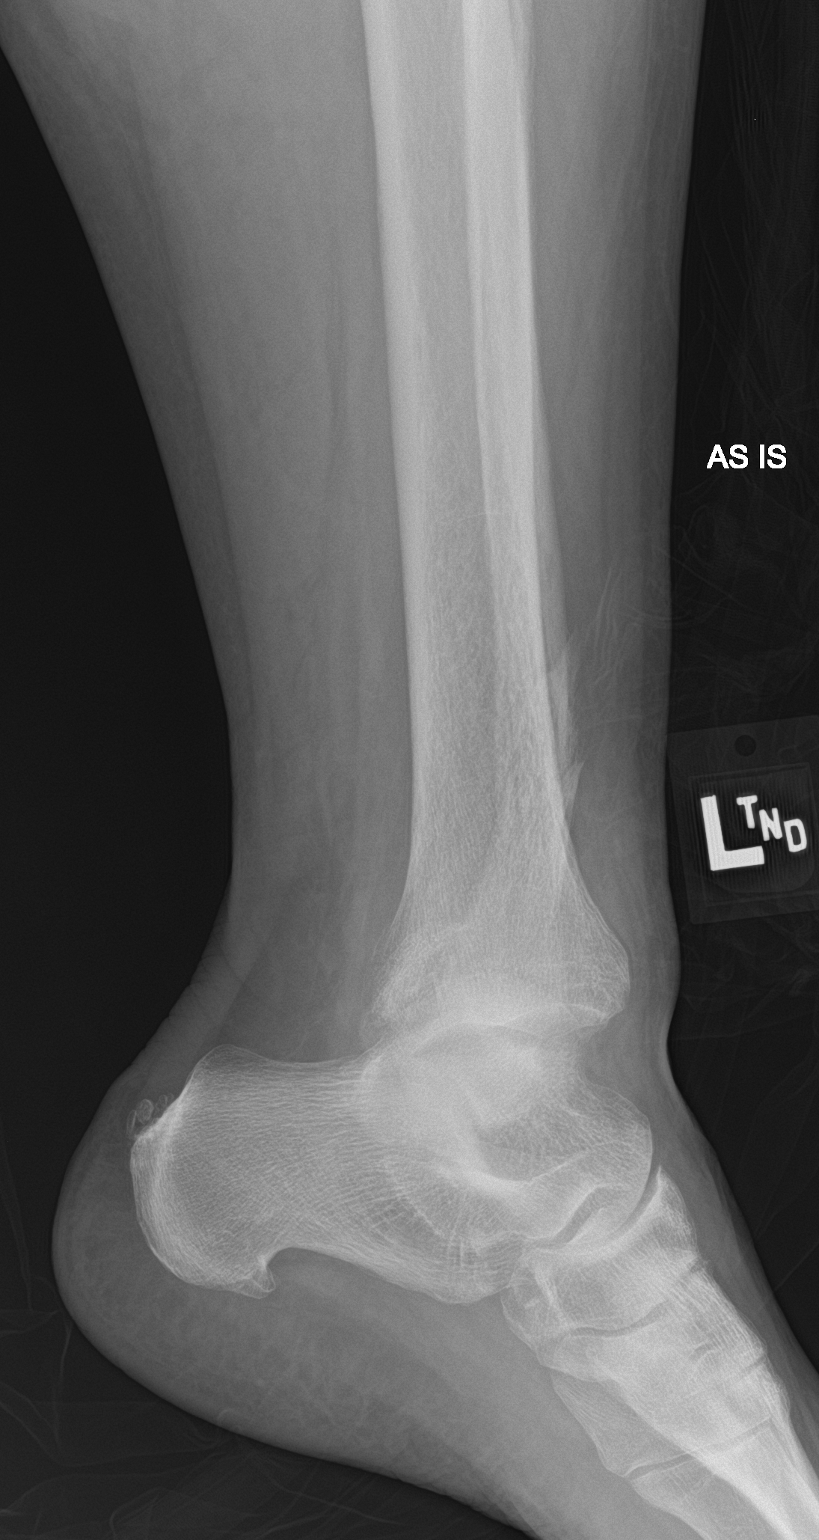

[3 of 3 positions shown; findings below may reference images not displayed]

FINDINGS: Severely displaced and comminuted distal left fibular fracture is
noted. Severely displaced fractures are seen involving the medial
and lateral portions of the distal tibia. There is severe lateral
dislocation of the talus relative to the tibia.
IMPRESSION: Severely displaced and comminuted distal left fibular fracture.
Severely displaced fractures are seen involving the medial and
lateral portions of the distal tibia with severe lateral dislocation
of the talus relative to distal tibia.
